# Patient Record
Sex: Female | Born: 1980
Health system: Southern US, Community
[De-identification: ages and names within clinical notes are randomized; demographics above are authoritative.]

## PROBLEM LIST (undated history)

## (undated) DIAGNOSIS — R87629 Unspecified abnormal cytological findings in specimens from vagina: Secondary | ICD-10-CM

## (undated) DIAGNOSIS — O139 Gestational [pregnancy-induced] hypertension without significant proteinuria, unspecified trimester: Secondary | ICD-10-CM

## (undated) HISTORY — DX: Gestational (pregnancy-induced) hypertension without significant proteinuria, unspecified trimester: O13.9

## (undated) HISTORY — PX: APPENDECTOMY: SHX54

## (undated) HISTORY — DX: Unspecified abnormal cytological findings in specimens from vagina: R87.629

---

## 2017-05-17 HISTORY — PX: WISDOM TOOTH EXTRACTION: SHX21

## 2018-05-18 DIAGNOSIS — O209 Hemorrhage in early pregnancy, unspecified: Secondary | ICD-10-CM | POA: Diagnosis not present

## 2018-05-31 DIAGNOSIS — O039 Complete or unspecified spontaneous abortion without complication: Secondary | ICD-10-CM | POA: Diagnosis not present

## 2018-05-31 DIAGNOSIS — O209 Hemorrhage in early pregnancy, unspecified: Secondary | ICD-10-CM | POA: Diagnosis not present

## 2019-02-18 DIAGNOSIS — H6120 Impacted cerumen, unspecified ear: Secondary | ICD-10-CM | POA: Diagnosis not present

## 2019-02-18 DIAGNOSIS — B029 Zoster without complications: Secondary | ICD-10-CM | POA: Diagnosis not present

## 2019-09-15 ENCOUNTER — Ambulatory Visit: Payer: Self-pay | Attending: Internal Medicine

## 2019-09-15 DIAGNOSIS — Z23 Encounter for immunization: Secondary | ICD-10-CM

## 2019-09-15 NOTE — Progress Notes (Signed)
   Covid-19 Vaccination Clinic  Name:  Meghan Bridges    MRN: [id number] DOB: 1980-07-26  09/15/2019  [name] was observed post Covid-19 immunization for 15 minutes without incident. She was provided with Vaccine Information Sheet and instruction to access the V-Safe system.   [name] was instructed to call 911 with any severe reactions post vaccine: Marland Kitchen Difficulty breathing  . Swelling of face and throat  . A fast heartbeat  . A bad rash all over body  . Dizziness and weakness   Immunizations Administered    Name Date Dose VIS Date Route   Pfizer COVID-19 Vaccine 09/15/2019 11:07 AM 0.3 mL 07/11/2018 Intramuscular   Manufacturer: [employer], [employer]   Lot: [id number]   NDC: [address]-2

## 2019-10-13 ENCOUNTER — Ambulatory Visit: Payer: Self-pay | Attending: Internal Medicine

## 2019-10-13 DIAGNOSIS — Z23 Encounter for immunization: Secondary | ICD-10-CM

## 2019-10-13 NOTE — Progress Notes (Signed)
   Covid-19 Vaccination Clinic  Name:  Meghan Bridges    MRN: [id number] DOB: 05-26-1980  10/13/2019  Meghan Bridges was observed post Covid-19 immunization for 15 minutes without incident. She was provided with Vaccine Information Sheet and instruction to access the V-Safe system.   Meghan Bridges was instructed to call 911 with any severe reactions post vaccine: Marland Kitchen Difficulty breathing  . Swelling of face and throat  . A fast heartbeat  . A bad rash all over body  . Dizziness and weakness   Immunizations Administered    Name Date Dose VIS Date Route   Pfizer COVID-19 Vaccine 10/13/2019  9:55 AM 0.3 mL 07/11/2018 Intramuscular   Manufacturer: [employer], [employer]   Lot: [id number]   NDC: [address]-8

## 2019-10-16 ENCOUNTER — Ambulatory Visit: Payer: Self-pay

## 2019-12-11 ENCOUNTER — Other Ambulatory Visit: Payer: Self-pay

## 2019-12-12 ENCOUNTER — Ambulatory Visit (INDEPENDENT_AMBULATORY_CARE_PROVIDER_SITE_OTHER): Payer: BC Managed Care – PPO | Admitting: Nurse Practitioner

## 2019-12-12 ENCOUNTER — Other Ambulatory Visit (HOSPITAL_COMMUNITY)
Admission: RE | Admit: 2019-12-12 | Discharge: 2019-12-12 | Disposition: A | Discharge status: Home / Self Care | Payer: BC Managed Care – PPO | Source: Ambulatory Visit | Attending: Nurse Practitioner | Admitting: Nurse Practitioner

## 2019-12-12 ENCOUNTER — Encounter: Payer: Self-pay | Admitting: Nurse Practitioner

## 2019-12-12 VITALS — BP 116/78 | HR 58 | Temp 97.7°F | Ht 64.0 in | Wt 154.6 lb

## 2019-12-12 DIAGNOSIS — Z113 Encounter for screening for infections with a predominantly sexual mode of transmission: Secondary | ICD-10-CM | POA: Insufficient documentation

## 2019-12-12 DIAGNOSIS — Z23 Encounter for immunization: Secondary | ICD-10-CM | POA: Diagnosis not present

## 2019-12-12 DIAGNOSIS — Z136 Encounter for screening for cardiovascular disorders: Secondary | ICD-10-CM

## 2019-12-12 DIAGNOSIS — Z13220 Encounter for screening for lipoid disorders: Secondary | ICD-10-CM | POA: Diagnosis not present

## 2019-12-12 DIAGNOSIS — R1013 Epigastric pain: Secondary | ICD-10-CM | POA: Insufficient documentation

## 2019-12-12 DIAGNOSIS — Z0000 Encounter for general adult medical examination without abnormal findings: Secondary | ICD-10-CM | POA: Diagnosis not present

## 2019-12-12 DIAGNOSIS — Z124 Encounter for screening for malignant neoplasm of cervix: Secondary | ICD-10-CM | POA: Diagnosis not present

## 2019-12-12 LAB — LIPID PANEL
Cholesterol: 188 mg/dL (ref 0–200)
HDL: 59.3 mg/dL (ref >39.00)
LDL Cholesterol: 117 mg/dL — ABNORMAL HIGH (ref 0–99)
NonHDL: 128.7
Total CHOL/HDL Ratio: 3
Triglycerides: 58 mg/dL (ref 0.0–149.0)
VLDL: 11.6 mg/dL (ref 0.0–40.0)

## 2019-12-12 LAB — COMPREHENSIVE METABOLIC PANEL
ALT: 13 U/L (ref 0–35)
AST: 18 U/L (ref 0–37)
Albumin: 4.4 g/dL (ref 3.5–5.2)
Alkaline Phosphatase: 28 U/L — ABNORMAL LOW (ref 39–117)
BUN: 12 mg/dL (ref 6–23)
CO2: 26 mEq/L (ref 19–32)
Calcium: 9.2 mg/dL (ref 8.4–10.5)
Chloride: 104 mEq/L (ref 96–112)
Creatinine, Ser: 0.93 mg/dL (ref 0.40–1.20)
GFR: 67.8 mL/min (ref >60.00)
Glucose, Bld: 114 mg/dL — ABNORMAL HIGH (ref 70–99)
Potassium: 4.1 mEq/L (ref 3.5–5.1)
Sodium: 135 mEq/L (ref 135–145)
Total Bilirubin: 0.6 mg/dL (ref 0.2–1.2)
Total Protein: 7.2 g/dL (ref 6.0–8.3)

## 2019-12-12 LAB — CBC
HCT: 40.6 % (ref 36.0–46.0)
Hemoglobin: 13.7 g/dL (ref 12.0–15.0)
MCHC: 33.8 g/dL (ref 30.0–36.0)
MCV: 87.2 fl (ref 78.0–100.0)
Platelets: 259 10*3/uL (ref 150.0–400.0)
RBC: 4.66 Mil/uL (ref 3.87–5.11)
RDW: 12.4 % (ref 11.5–15.5)
WBC: 6.4 10*3/uL (ref 4.0–10.5)

## 2019-12-12 LAB — TSH: TSH: 2.54 u[IU]/mL (ref 0.35–4.50)

## 2019-12-12 NOTE — Patient Instructions (Signed)
Thank you for choosing [facility] for your health needs.  Go to lab for blood draw.  Maintain adequate oral hydration, high fiber diet, and daily exercise. Start probiotic 1cap daily x 74month (florastor or align or live brand from healthy food store).  Diet for Irritable Bowel Syndrome When you have irritable bowel syndrome (IBS), it is very important to eat the foods and follow the eating habits that are best for your condition. IBS may cause various symptoms such as pain in the abdomen, constipation, or diarrhea. Choosing the right foods can help to ease the discomfort from these symptoms. Work with your health care provider and diet and nutrition specialist (dietitian) to find the eating plan that will help to control your symptoms. What are tips for following this plan?      Keep a food diary. This will help you identify foods that cause symptoms. Write down: ? What you eat and when you eat it. ? What symptoms you have. ? When symptoms occur in relation to your meals, such as "pain in abdomen 2 hours after dinner."  Eat your meals slowly and in a relaxed setting.  Aim to eat 5-6 small meals per day. Do not skip meals.  Drink enough fluid to keep your urine pale yellow.  Ask your health care provider if you should take an over-the-counter probiotic to help restore healthy bacteria in your gut (digestive tract). ? Probiotics are foods that contain good bacteria and yeasts.  Your dietitian may have specific dietary recommendations for you based on your symptoms. He or she may recommend that you: ? Avoid foods that cause symptoms. Talk with your dietitian about other ways to get the same nutrients that are in those problem foods. ? Avoid foods with gluten. Gluten is a protein that is found in rye, wheat, and barley. ? Eat more foods that contain soluble fiber. Examples of foods with high soluble fiber include oats, seeds, and certain fruits and vegetables. Take a fiber  supplement if directed by your dietitian. ? Reduce or avoid certain foods called FODMAPs. These are foods that contain carbohydrates that are hard to digest. Ask your doctor which foods contain these carbohydrates. What foods are not recommended? The following are some foods and drinks that may make your symptoms worse:  Fatty foods, such as french fries.  Foods that contain gluten, such as pasta and cereal.  Dairy products, such as milk, cheese, and ice cream.  Chocolate.  Alcohol.  Products with caffeine, such as coffee.  Carbonated drinks, such as soda.  Foods that are high in FODMAPs. These include certain fruits and vegetables.  Products with sweeteners such as honey, high fructose corn syrup, sorbitol, and mannitol. The items listed above may not be a complete list of foods and beverages you should avoid. Contact a dietitian for more information. What foods are good sources of fiber? Your health care provider or dietitian may recommend that you eat more foods that contain fiber. Fiber can help to reduce constipation and other IBS symptoms. Add foods with fiber to your diet a little at a time so your body can get used to them. Too much fiber at one time might cause gas and swelling of your abdomen. The following are some foods that are good sources of fiber:  Berries, such as raspberries, strawberries, and blueberries.  Tomatoes.  Carrots.  Brown rice.  Oats.  Seeds, such as chia and pumpkin seeds. The items listed above may not be a complete list  of recommended sources of fiber. Contact your dietitian for more options. Where to find more information  International Foundation for Functional Gastrointestinal Disorders: www.iffgd.[employer] of Diabetes and Digestive and Kidney Diseases: CarFlippers.tn Summary  When you have irritable bowel syndrome (IBS), it is very important to eat the foods and follow the eating habits that are best for your  condition.  IBS may cause various symptoms such as pain in the abdomen, constipation, or diarrhea.  Choosing the right foods can help to ease the discomfort that comes from symptoms.  Keep a food diary. This will help you identify foods that cause symptoms.  Your health care provider or diet and nutrition specialist (dietitian) may recommend that you eat more foods that contain fiber. This information is not intended to replace advice given to you by your health care provider. Make sure you discuss any questions you have with your health care provider. Document Revised: 08/23/2018 Document Reviewed: 01/04/2017 Elsevier Patient Education  2020 [employer].

## 2019-12-12 NOTE — Progress Notes (Signed)
Subjective:    Patient ID: Meghan Bridges, female    DOB: 26-Aug-1980, 39 y.o.   MRN: [id number]  Patient presents today for CPE, establish care (new patient) and eval of ABD bloating  Abdominal Cramping This is a chronic problem. The current episode started more than 1 year ago. The onset quality is gradual. The problem occurs intermittently. The problem has been waxing and waning. The pain is located in the generalized abdominal region. The quality of the pain is a sensation of fullness and colicky. The abdominal pain does not radiate. Associated symptoms include belching. Pertinent negatives include no anorexia, arthralgias, constipation, diarrhea, dysuria, fever, flatus, frequency, headaches, hematochezia, hematuria, melena, myalgias, nausea, vomiting or weight loss. The pain is aggravated by eating. Relieved by: avoiding high gluten and diary. She has tried antacids (avoiding certain foods) for the symptoms. The treatment provided significant relief. There is no history of GERD, irritable bowel syndrome or pancreatitis.   Sexual History (orientation,birth control, marital status, STD): married, sexually active, no child  Depression/Suicide: Depression screen [facility] 2/9 12/12/2019  Decreased Interest 0  Down, Depressed, Hopeless 0  PHQ - 2 Score 0   Vision:up to date  Dental:up to date  Immunizations: (TDAP, Hep C screen, Pneumovax, Influenza, zoster)  Health Maintenance  Topic Date Due   Flu Shot  12/16/2019   Pap Smear  12/12/2022   Tetanus Vaccine  12/11/2029   COVID-19 Vaccine  Completed    Hepatitis C: One time screening is recommended by Center for Disease Control  (CDC) for  adults born from [birth year] through 1963.   Completed   HIV Screening  Completed   Diet:regular.  Weight:  Wt Readings from Last 3 Encounters:  12/12/19 154 lb 9.6 oz (70.1 kg)   Fall Risk: Fall Risk  12/12/2019  Falls in the past year? 0     Medications and allergies reviewed with patient and  updated if appropriate.  Patient Active Problem List   Diagnosis Date Noted   Dyspepsia 12/12/2019    No current outpatient medications on file prior to visit.   No current facility-administered medications on file prior to visit.   No past medical history on file.   Social History   Socioeconomic History   Marital status: Married    Spouse name: Not on file   Number of children: Not on file   Years of education: Not on file   Highest education level: Not on file  Occupational History   Not on file  Tobacco Use   Smoking status: Former Smoker    Types: Cigarettes   Smokeless tobacco: Never Used  Building services engineer Use: Never used  Substance and Sexual Activity   Alcohol use: Never   Drug use: Never   Sexual activity: Yes    Birth control/protection: None  Other Topics Concern   Not on file  Social History Narrative   Not on file   Social Determinants of Health   Financial Resource Strain:    Difficulty of Paying Living Expenses:   Food Insecurity:    Worried About Programme researcher, broadcasting/film/video in the Last Year:    Barista in the Last Year:   Transportation Needs:    Freight forwarder (Medical):    Lack of Transportation (Non-Medical):   Physical Activity:    Days of Exercise per Week:    Minutes of Exercise per Session:   Stress:    Feeling of Stress :   Social Connections:  Frequency of Communication with Friends and Family:    Frequency of Social Gatherings with Friends and Family:    Attends Religious Services:    Active Member of Clubs or Organizations:    Attends Engineer, structural:    Marital Status:     Family History  Problem Relation Age of Onset   Heart disease Mother    Heart disease Father    Mental illness Maternal Uncle    Alzheimer's disease Maternal Grandmother    Cancer Maternal Grandfather        Review of Systems  Constitutional: Negative for fever and weight loss.    Gastrointestinal: Negative for anorexia, constipation, diarrhea, flatus, hematochezia, melena, nausea and vomiting.  Genitourinary: Negative for dysuria, frequency and hematuria.  Musculoskeletal: Negative for arthralgias and myalgias.  Neurological: Negative for headaches.    Objective:   Vitals:   12/12/19 0854  BP: 116/78  Pulse: 58  Temp: 97.7 F (36.5 C)  SpO2: 98%    Body mass index is 26.54 kg/m.   Physical Examination:  Physical Exam Vitals reviewed. Exam conducted with a chaperone present.  Constitutional:      General: She is not in acute distress.    Appearance: She is well-developed.  HENT:     Right Ear: Tympanic membrane, ear canal and external ear normal.     Left Ear: Tympanic membrane, ear canal and external ear normal.  Eyes:     Extraocular Movements: Extraocular movements intact.     Conjunctiva/sclera: Conjunctivae normal.  Cardiovascular:     Rate and Rhythm: Normal rate and regular rhythm.     Heart sounds: Normal heart sounds.  Pulmonary:     Effort: Pulmonary effort is normal. No respiratory distress.     Breath sounds: Normal breath sounds.  Chest:     Chest wall: No tenderness.     Breasts:        Right: Normal.        Left: Normal.  Abdominal:     General: Bowel sounds are normal.     Palpations: Abdomen is soft.  Genitourinary:    Labia:        Right: No rash or tenderness.        Left: No rash or tenderness.      Vagina: Normal.     Cervix: Normal.     Uterus: Normal.      Adnexa: Right adnexa normal and left adnexa normal.  Musculoskeletal:        General: Normal range of motion.     Cervical back: Normal range of motion and neck supple.     Right lower leg: No edema.     Left lower leg: No edema.  Lymphadenopathy:     Cervical: No cervical adenopathy.     Upper Body:     Right upper body: No supraclavicular, axillary or pectoral adenopathy.     Left upper body: No supraclavicular, axillary or pectoral adenopathy.      Lower Body: No right inguinal adenopathy. No left inguinal adenopathy.  Skin:    General: Skin is warm and dry.  Neurological:     Mental Status: She is alert and oriented to person, place, and time.     Deep Tendon Reflexes: Reflexes are normal and symmetric.  Psychiatric:        Mood and Affect: Mood normal.        Behavior: Behavior normal.        Thought Content: Thought content normal.  ASSESSMENT and PLAN: This visit occurred during the SARS-CoV-2 public health emergency.  Safety protocols were in place, including screening questions prior to the visit, additional usage of staff PPE, and extensive cleaning of exam room while observing appropriate contact time as indicated for disinfecting solutions.   Charlena was seen today for establish care.  Diagnoses and all orders for this visit:  Preventative health care -     CBC -     Comprehensive metabolic panel -     TSH -     Lipid panel  Encounter for lipid screening for cardiovascular disease -     Lipid panel  Screen for STD (sexually transmitted disease) -     Cervicovaginal ancillary only( [place]) -     HIV antibody (with reflex) -     Hepatitis C Antibody  Encounter for Papanicolaou smear for cervical cancer screening -     Cytology - PAP( [place])  Need for Tdap vaccination -     Tdap vaccine greater than or equal to 7yo IM  Dyspepsia        Problem List Items Addressed This Visit      Other   Dyspepsia    Chronic, waxing and waning, trigger by certain foods (gluten and diary). Use TUMs or prilosec or famotidine prn. Maintain adequate oral hydration, high fiber diet, and daily exercise. Avoid triggers Start probiotic 1cap daily x 23month (florastor or align or live brand from healthy food store).       Other Visit Diagnoses    Preventative health care    -  Primary   Relevant Orders   CBC (Completed)   Comprehensive metabolic panel (Completed)   TSH (Completed)   Lipid panel  (Completed)   Encounter for lipid screening for cardiovascular disease       Relevant Orders   Lipid panel (Completed)   Screen for STD (sexually transmitted disease)       Relevant Orders   Cervicovaginal ancillary only( [place]) (Completed)   HIV antibody (with reflex) (Completed)   Hepatitis C Antibody (Completed)   Encounter for Papanicolaou smear for cervical cancer screening       Relevant Orders   Cytology - PAP( [place]) (Completed)   Need for Tdap vaccination       Relevant Orders   Tdap vaccine greater than or equal to 7yo IM (Completed)      Follow up: Return in about 1 year (around 12/11/2020) for CPE (fasting).  Alysia Penna, NP

## 2019-12-13 LAB — CERVICOVAGINAL ANCILLARY ONLY
Chlamydia: NEGATIVE
Comment: NEGATIVE
Comment: NEGATIVE
Comment: NORMAL
Neisseria Gonorrhea: NEGATIVE
Trichomonas: NEGATIVE

## 2019-12-13 LAB — HIV ANTIBODY (ROUTINE TESTING W REFLEX): HIV 1&2 Ab, 4th Generation: NONREACTIVE

## 2019-12-13 LAB — CYTOLOGY - PAP
Comment: NEGATIVE
Diagnosis: NEGATIVE
High risk HPV: NEGATIVE

## 2019-12-13 LAB — HEPATITIS C ANTIBODY
Hepatitis C Ab: NONREACTIVE
SIGNAL TO CUT-OFF: 0.01 (ref <1.00)

## 2019-12-14 ENCOUNTER — Encounter: Payer: Self-pay | Admitting: Nurse Practitioner

## 2019-12-14 NOTE — Assessment & Plan Note (Signed)
Chronic, waxing and waning, trigger by certain foods (gluten and diary). Use TUMs or prilosec or famotidine prn. Maintain adequate oral hydration, high fiber diet, and daily exercise. Avoid triggers Start probiotic 1cap daily x 43month (florastor or align or live brand from healthy food store).

## 2020-02-25 ENCOUNTER — Telehealth: Payer: Self-pay | Admitting: Nurse Practitioner

## 2020-02-25 ENCOUNTER — Other Ambulatory Visit: Payer: Self-pay

## 2020-02-25 NOTE — Telephone Encounter (Signed)
Patient is calling and wanted to see if Claris Gower can put in a referral to see an OBGYN due to pregnancy, please advise. CB is [phone]

## 2020-02-26 ENCOUNTER — Other Ambulatory Visit: Payer: Self-pay

## 2020-02-26 ENCOUNTER — Ambulatory Visit: Payer: BC Managed Care – PPO | Admitting: Nurse Practitioner

## 2020-02-26 DIAGNOSIS — Z3A01 Less than 8 weeks gestation of pregnancy: Secondary | ICD-10-CM

## 2020-02-29 NOTE — Telephone Encounter (Signed)
Patient called back and wanted to see if her OBGYN referral can be switched to [facility], please advise. CB is [phone]

## 2020-03-03 NOTE — Telephone Encounter (Signed)
Done

## 2020-03-06 DIAGNOSIS — Z3200 Encounter for pregnancy test, result unknown: Secondary | ICD-10-CM | POA: Diagnosis not present

## 2020-03-06 DIAGNOSIS — Z3689 Encounter for other specified antenatal screening: Secondary | ICD-10-CM | POA: Diagnosis not present

## 2020-03-13 DIAGNOSIS — Z3201 Encounter for pregnancy test, result positive: Secondary | ICD-10-CM | POA: Diagnosis not present

## 2020-03-26 DIAGNOSIS — O09299 Supervision of pregnancy with other poor reproductive or obstetric history, unspecified trimester: Secondary | ICD-10-CM | POA: Diagnosis not present

## 2020-03-26 DIAGNOSIS — Z3A10 10 weeks gestation of pregnancy: Secondary | ICD-10-CM | POA: Diagnosis not present

## 2020-03-26 DIAGNOSIS — Z3689 Encounter for other specified antenatal screening: Secondary | ICD-10-CM | POA: Diagnosis not present

## 2020-03-26 DIAGNOSIS — O09291 Supervision of pregnancy with other poor reproductive or obstetric history, first trimester: Secondary | ICD-10-CM | POA: Diagnosis not present

## 2020-04-07 DIAGNOSIS — Z3682 Encounter for antenatal screening for nuchal translucency: Secondary | ICD-10-CM | POA: Diagnosis not present

## 2020-04-07 DIAGNOSIS — O09521 Supervision of elderly multigravida, first trimester: Secondary | ICD-10-CM | POA: Diagnosis not present

## 2020-04-07 DIAGNOSIS — Z3A12 12 weeks gestation of pregnancy: Secondary | ICD-10-CM | POA: Diagnosis not present

## 2020-05-06 DIAGNOSIS — Z361 Encounter for antenatal screening for raised alphafetoprotein level: Secondary | ICD-10-CM | POA: Diagnosis not present

## 2020-05-17 NOTE — L&D Delivery Note (Signed)
Delivery Note:   [name] at [redacted]w[redacted]d  Admitting diagnosis: Encounter for induction of labor [Z34.90] PIH (pregnancy induced hypertension) [O13.9] Risks: Gestational hypertension   First Stage:  Induction of labor: s/p buccal cytotec x 2 doses, intracervical foley balloon Onset of labor: 0310 Augmentation: AROM and Pitocin AROM: 0310 clear fluid Active labor onset: approx 8am Analgesia /Anesthesia/Pain control intrapartum: Epidural   Second Stage:  Complete dilation at 09/29/2020  1444 Onset of pushing at 1521 FHR second stage Category 2  Pushing in lithotomy position with CNM, doula and L&D staff support, spouse, Alycia Rossetti present for birth and supportive. Nuchal Cord: No  Delivery of a Live born female "Hillary Bow" Birth Weight:  pending APGAR: 8, 9  Newborn Delivery   Birth date/time: 09/29/2020 15:43:00 Delivery type: Vaginal, Spontaneous      in cephalic presentation, position ROA to ROT, shoulders and body easily followed  Cord double clamped after cessation of pulsation, cut by FOB.  Collection of cord blood for typing completed. Cord blood donation-None  Arterial cord blood sample-No    Third Stage:  Placenta delivered-Spontaneous  with 3 vessels . Marginal cord insertion noted.  Uterine tone firm bleeding appropriate Uterotonics: IV Pitocin bolus Placenta to hospital disposal.   2nd degree perineal laceration identified.  Episiotomy:None  Local analgesia: 1% Lidocaine  Repair: 3.0 vicryl in standard fashion with separate subcuticular layer closure.  Good hemostasis noted Est. Blood Loss (mL):0.00  Complications: None   Mom to postpartum.  Baby "Hillary Bow" to Couplet care / Skin to Skin.  Delivery Report:  Review the Delivery Report for details.     Signed: Karena Addison, CNM, MSN 09/29/2020, 4:43 PM

## 2020-05-29 DIAGNOSIS — O358XX0 Maternal care for other (suspected) fetal abnormality and damage, not applicable or unspecified: Secondary | ICD-10-CM | POA: Diagnosis not present

## 2020-05-29 DIAGNOSIS — O09522 Supervision of elderly multigravida, second trimester: Secondary | ICD-10-CM | POA: Diagnosis not present

## 2020-05-29 DIAGNOSIS — Z363 Encounter for antenatal screening for malformations: Secondary | ICD-10-CM | POA: Diagnosis not present

## 2020-05-29 DIAGNOSIS — Z3A19 19 weeks gestation of pregnancy: Secondary | ICD-10-CM | POA: Diagnosis not present

## 2020-06-03 ENCOUNTER — Telehealth: Payer: Self-pay

## 2020-06-03 NOTE — Telephone Encounter (Signed)
Left vm for patient and sending her a message in MyChart to get appt scheduled with [place].    Good morning,   We received a referral from [facility] to get you scheduled for a [place] MFM OB DETAIL + 14 WK Ultrasound!  We reached out to you and left a voicemail for you to call back. If you could give our scheduling team a call at [phone], we would love to get it scheduled for you!   Our Location:   [facility] (Second Floor)  Address:   [address]  [place], [place] [address]  We would like to make you aware that you are allowed to have one person with you for your ultrasound that is 13 years or older. If you have children 12 years or older, they must have an adult present with them in the lobby while you are in your appointment.

## 2020-06-09 NOTE — Telephone Encounter (Signed)
Called patient.  Patient stated she's seeking a second opinion due to the result of her ultrasound. Patient will call back if still needs to see Maternal Medicine doctors.  Patient marriage name is [place].

## 2020-06-24 ENCOUNTER — Other Ambulatory Visit: Payer: Self-pay | Admitting: Obstetrics and Gynecology

## 2020-06-24 DIAGNOSIS — Z3A24 24 weeks gestation of pregnancy: Secondary | ICD-10-CM

## 2020-06-24 DIAGNOSIS — Z363 Encounter for antenatal screening for malformations: Secondary | ICD-10-CM

## 2020-06-24 DIAGNOSIS — O283 Abnormal ultrasonic finding on antenatal screening of mother: Secondary | ICD-10-CM

## 2020-06-27 ENCOUNTER — Encounter: Payer: Self-pay | Admitting: *Deleted

## 2020-07-01 ENCOUNTER — Encounter: Payer: Self-pay | Admitting: *Deleted

## 2020-07-01 ENCOUNTER — Other Ambulatory Visit: Payer: Self-pay

## 2020-07-01 ENCOUNTER — Ambulatory Visit: Payer: BC Managed Care – PPO | Admitting: *Deleted

## 2020-07-01 ENCOUNTER — Ambulatory Visit: Payer: BC Managed Care – PPO | Attending: Obstetrics and Gynecology

## 2020-07-01 ENCOUNTER — Other Ambulatory Visit: Payer: Self-pay | Admitting: *Deleted

## 2020-07-01 VITALS — BP 115/72 | HR 91

## 2020-07-01 DIAGNOSIS — O09522 Supervision of elderly multigravida, second trimester: Secondary | ICD-10-CM | POA: Diagnosis not present

## 2020-07-01 DIAGNOSIS — O283 Abnormal ultrasonic finding on antenatal screening of mother: Secondary | ICD-10-CM | POA: Diagnosis not present

## 2020-07-01 DIAGNOSIS — Z369 Encounter for antenatal screening, unspecified: Secondary | ICD-10-CM | POA: Diagnosis not present

## 2020-07-01 DIAGNOSIS — Z363 Encounter for antenatal screening for malformations: Secondary | ICD-10-CM | POA: Diagnosis not present

## 2020-07-01 DIAGNOSIS — Z3A24 24 weeks gestation of pregnancy: Secondary | ICD-10-CM | POA: Diagnosis not present

## 2020-07-01 DIAGNOSIS — O358XX0 Maternal care for other (suspected) fetal abnormality and damage, not applicable or unspecified: Secondary | ICD-10-CM | POA: Insufficient documentation

## 2020-07-14 DIAGNOSIS — Z3689 Encounter for other specified antenatal screening: Secondary | ICD-10-CM | POA: Diagnosis not present

## 2020-08-04 DIAGNOSIS — Z23 Encounter for immunization: Secondary | ICD-10-CM | POA: Diagnosis not present

## 2020-08-04 DIAGNOSIS — Z3689 Encounter for other specified antenatal screening: Secondary | ICD-10-CM | POA: Diagnosis not present

## 2020-08-05 ENCOUNTER — Ambulatory Visit: Payer: BC Managed Care – PPO

## 2020-08-08 DIAGNOSIS — Z3A29 29 weeks gestation of pregnancy: Secondary | ICD-10-CM | POA: Diagnosis not present

## 2020-08-08 DIAGNOSIS — O43123 Velamentous insertion of umbilical cord, third trimester: Secondary | ICD-10-CM | POA: Diagnosis not present

## 2020-09-03 DIAGNOSIS — O43123 Velamentous insertion of umbilical cord, third trimester: Secondary | ICD-10-CM | POA: Diagnosis not present

## 2020-09-03 DIAGNOSIS — Z3A33 33 weeks gestation of pregnancy: Secondary | ICD-10-CM | POA: Diagnosis not present

## 2020-09-23 DIAGNOSIS — O43123 Velamentous insertion of umbilical cord, third trimester: Secondary | ICD-10-CM | POA: Diagnosis not present

## 2020-09-23 DIAGNOSIS — Z3A36 36 weeks gestation of pregnancy: Secondary | ICD-10-CM | POA: Diagnosis not present

## 2020-09-23 DIAGNOSIS — Z3685 Encounter for antenatal screening for Streptococcus B: Secondary | ICD-10-CM | POA: Diagnosis not present

## 2020-09-23 DIAGNOSIS — R030 Elevated blood-pressure reading, without diagnosis of hypertension: Secondary | ICD-10-CM | POA: Diagnosis not present

## 2020-09-23 LAB — OB RESULTS CONSOLE GBS: GBS: NEGATIVE

## 2020-09-24 ENCOUNTER — Encounter (HOSPITAL_COMMUNITY): Payer: Self-pay | Admitting: Obstetrics and Gynecology

## 2020-09-24 ENCOUNTER — Inpatient Hospital Stay (HOSPITAL_COMMUNITY)
Admission: AD | Admit: 2020-09-24 | Discharge: 2020-09-24 | Disposition: A | Discharge status: Home / Self Care | Payer: BC Managed Care – PPO | Attending: Obstetrics and Gynecology | Admitting: Obstetrics and Gynecology

## 2020-09-24 ENCOUNTER — Other Ambulatory Visit: Payer: Self-pay

## 2020-09-24 DIAGNOSIS — R519 Headache, unspecified: Secondary | ICD-10-CM | POA: Diagnosis not present

## 2020-09-24 DIAGNOSIS — O26893 Other specified pregnancy related conditions, third trimester: Secondary | ICD-10-CM | POA: Diagnosis not present

## 2020-09-24 DIAGNOSIS — Z3A36 36 weeks gestation of pregnancy: Secondary | ICD-10-CM | POA: Diagnosis not present

## 2020-09-24 DIAGNOSIS — Z87891 Personal history of nicotine dependence: Secondary | ICD-10-CM | POA: Diagnosis not present

## 2020-09-24 DIAGNOSIS — O133 Gestational [pregnancy-induced] hypertension without significant proteinuria, third trimester: Secondary | ICD-10-CM | POA: Diagnosis not present

## 2020-09-24 DIAGNOSIS — Z8249 Family history of ischemic heart disease and other diseases of the circulatory system: Secondary | ICD-10-CM | POA: Insufficient documentation

## 2020-09-24 DIAGNOSIS — Z3490 Encounter for supervision of normal pregnancy, unspecified, unspecified trimester: Secondary | ICD-10-CM

## 2020-09-24 DIAGNOSIS — M7989 Other specified soft tissue disorders: Secondary | ICD-10-CM | POA: Diagnosis not present

## 2020-09-24 DIAGNOSIS — O139 Gestational [pregnancy-induced] hypertension without significant proteinuria, unspecified trimester: Secondary | ICD-10-CM | POA: Diagnosis present

## 2020-09-24 LAB — COMPREHENSIVE METABOLIC PANEL
ALT: 24 U/L (ref 0–44)
AST: 26 U/L (ref 15–41)
Albumin: 3 g/dL — ABNORMAL LOW (ref 3.5–5.0)
Alkaline Phosphatase: 98 U/L (ref 38–126)
Anion gap: 8 (ref 5–15)
BUN: 11 mg/dL (ref 6–20)
CO2: 23 mmol/L (ref 22–32)
Calcium: 9.3 mg/dL (ref 8.9–10.3)
Chloride: 105 mmol/L (ref 98–111)
Creatinine, Ser: 0.74 mg/dL (ref 0.44–1.00)
GFR, Estimated: >60 mL/min (ref >60)
Glucose, Bld: 108 mg/dL — ABNORMAL HIGH (ref 70–99)
Potassium: 4.4 mmol/L (ref 3.5–5.1)
Sodium: 136 mmol/L (ref 135–145)
Total Bilirubin: 0.6 mg/dL (ref 0.3–1.2)
Total Protein: 6.3 g/dL — ABNORMAL LOW (ref 6.5–8.1)

## 2020-09-24 LAB — CBC
HCT: 40.1 % (ref 36.0–46.0)
Hemoglobin: 13.4 g/dL (ref 12.0–15.0)
MCH: 30.4 pg (ref 26.0–34.0)
MCHC: 33.4 g/dL (ref 30.0–36.0)
MCV: 90.9 fL (ref 80.0–100.0)
Platelets: 206 10*3/uL (ref 150–400)
RBC: 4.41 MIL/uL (ref 3.87–5.11)
RDW: 13.4 % (ref 11.5–15.5)
WBC: 8.6 10*3/uL (ref 4.0–10.5)
nRBC: 0 % (ref 0.0–0.2)

## 2020-09-24 LAB — PROTEIN / CREATININE RATIO, URINE
Creatinine, Urine: 26.65 mg/dL
Total Protein, Urine: <6 mg/dL

## 2020-09-24 MED ORDER — NIFEDIPINE ER 30 MG PO TB24: 30.0000 mg | ORAL_TABLET | Freq: Every day | ORAL | 0 refills | Status: AC

## 2020-09-24 MED ORDER — NIFEDIPINE ER OSMOTIC RELEASE 30 MG PO TB24
30.0000 mg | ORAL_TABLET | Freq: Every day | ORAL | Status: DC
  Administered 2020-09-24: 30 mg via ORAL
  Filled 2020-09-24: qty 1

## 2020-09-24 NOTE — MAU Note (Signed)
Pt reports she was sent from her doctor's office for pre-eclampsia workup. Reports swelling and headache, denies blurry vision, but is dizzy. Denies cntrx, Vag bleeding. +FM

## 2020-09-24 NOTE — MAU Provider Note (Addendum)
History    Meghan Bridges is a 40 y.o. [name] at [redacted]w[redacted]d sent from office for extended monitoring, serial BP and PEC labs after being seen for elevated BP and found to have severe range pressures 160's/100's. Prior to this she had been seen a day ago and noted to have mild range BP which decreased to high normal on recheck. No neural sx and PEC labs yesterday wnl except PCR 0.26. Today she denies any PEC sx, notes vague head pressure but not overt HA.  No ctx, no LOF/VB, (+)FM.   CSN: [id number]  Arrival date and time: 09/24/20 1552   Event Date/Time   First Provider Initiated Contact with Patient 09/24/20 1634      Chief Complaint  Patient presents with  . Hypertension  . Edema  . Headache   HPI  OB History    Gravida  3   Para      Term      Preterm      AB  2   Living  0     SAB  2   IAB      Ectopic      Multiple      Live Births              Past Medical History:  Diagnosis Date  . Vaginal Pap smear, abnormal     Past Surgical History:  Procedure Laterality Date  . APPENDECTOMY    . WISDOM TOOTH EXTRACTION  2019    Family History  Problem Relation Age of Onset  . Heart disease Mother   . Heart disease Father   . Mental illness Maternal Uncle   . Alzheimer's disease Maternal Grandmother   . Cancer Maternal Grandfather     Social History   Tobacco Use  . Smoking status: Former Smoker    Types: Cigarettes  . Smokeless tobacco: Never Used  Vaping Use  . Vaping Use: Never used  Substance Use Topics  . Alcohol use: Never  . Drug use: Never    Allergies:  Allergies  Allergen Reactions  . Latex Itching    Medications Prior to Admission  Medication Sig Dispense Refill Last Dose  . Prenatal Vit-Fe Fumarate-FA (PRENATAL MULTIVITAMIN) TABS tablet Take 1 tablet by mouth daily at 12 noon.   09/23/2020 at Unknown time    Review of Systems  As noted above  Physical Exam   Blood pressure (!) 144/91, pulse 85, temperature 98 F  (36.7 C), temperature source Oral, resp. rate 16, height 5\' 4"  (1.626 m), weight 87.2 kg, last menstrual period 01/13/2020, SpO2 98 %.   Patient Vitals for the past 24 hrs:  BP Temp Temp src Pulse Resp SpO2 Height Weight  09/24/20 1920 128/89 98.2 F (36.8 C) Oral 94 18 99 % -- --  09/24/20 1846 139/89 -- -- 91 -- -- -- --  09/24/20 1831 (!) 144/91 -- -- 85 -- -- -- --  09/24/20 1816 (!) 143/93 -- -- 86 -- -- -- --  09/24/20 1801 (!) 154/93 -- -- 89 -- -- -- --  09/24/20 1746 (!) 141/94 -- -- 87 -- -- -- --  09/24/20 1731 (!) 136/97 -- -- 100 -- -- -- --  09/24/20 1716 (!) 141/95 -- -- 87 -- -- -- --  09/24/20 1701 (!) 134/91 -- -- 81 -- -- -- --  09/24/20 1646 136/90 -- -- 94 -- -- -- --  09/24/20 1631 127/88 -- -- 96 -- -- -- --  09/24/20 1630  119/81 -- -- (!) 110 -- -- -- --  09/24/20 1628 108/80 -- -- 99 -- -- -- --  09/24/20 1605 (!) 135/95 98 F (36.7 C) Oral 89 16 98 % 5\' 4"  (1.626 m) 87.2 kg    Physical Exam  Gen: AAO x 3, NAD, comfortable CV: RRR Pulm: CTAB ABd: gravid, NT, S+D GU: SVE 1/50/-3 Ext: no edema, no clonus  EFM FHR 140, mod var, + accels, no decels Ctx occasional, mild  Results for orders placed or performed during the hospital encounter of 09/24/20 (from the past 24 hour(s))  Protein / creatinine ratio, urine     Status: None   Collection Time: 09/24/20  4:10 PM  Result Value Ref Range   Creatinine, Urine 26.65 mg/dL   Total Protein, Urine <6 mg/dL   Protein Creatinine Ratio        0.00 - 0.15 mg/mg[Cre]  CBC     Status: None   Collection Time: 09/24/20  4:16 PM  Result Value Ref Range   WBC 8.6 4.0 - 10.5 K/uL   RBC 4.41 3.87 - 5.11 MIL/uL   Hemoglobin 13.4 12.0 - 15.0 g/dL   HCT 11/24/20 51.7 - 61.6 %   MCV 90.9 80.0 - 100.0 fL   MCH 30.4 26.0 - 34.0 pg   MCHC 33.4 30.0 - 36.0 g/dL   RDW 07.3 71.0 - 62.6 %   Platelets 206 150 - 400 K/uL   nRBC 0.0 0.0 - 0.2 %  Comprehensive metabolic panel     Status: Abnormal   Collection Time: 09/24/20   4:16 PM  Result Value Ref Range   Sodium 136 135 - 145 mmol/L   Potassium 4.4 3.5 - 5.1 mmol/L   Chloride 105 98 - 111 mmol/L   CO2 23 22 - 32 mmol/L   Glucose, Bld 108 (H) 70 - 99 mg/dL   BUN 11 6 - 20 mg/dL   Creatinine, Ser 11/24/20 0.44 - 1.00 mg/dL   Calcium 9.3 8.9 - 5.46 mg/dL   Total Protein 6.3 (L) 6.5 - 8.1 g/dL   Albumin 3.0 (L) 3.5 - 5.0 g/dL   AST 26 15 - 41 U/L   ALT 24 0 - 44 U/L   Alkaline Phosphatase 98 38 - 126 U/L   Total Bilirubin 0.6 0.3 - 1.2 mg/dL   GFR, Estimated 27.0 >35 mL/min   Anion gap 8 5 - 15   MAU Course  Procedures Serial BP, NST, PEC labs  MDM Patient monitored in MAU for several hours. BP noted to be labile, preeclampsia labs stable and no trend noted.  Started on Procardia 30 XL and given preeclamptic reportable symptoms instructions.  Patient will follow up in office for BP and ANFT on Friday with planned IOL on Sunday/Monday at 37 wks.    Assessment and Plan  [name] at [redacted]w[redacted]d  Gestational hypertension FHT category 1  Started Procardia 30XL Start vaginal evening primrose oil nightly for cervical ripening Follow up in office 5/13 for BP check and modified BPP Scheduled IOL 37 wks  PEC precautions and modified rest reviewed.   POC in consult w/ Dr. 6/13.   Conni Elliot, MSN, CNM 09/24/2020, 8:25 PM   11/24/2020 09/24/2020, 7:03 PM

## 2020-09-24 NOTE — MAU Provider Note (Signed)
History     CSN: [id number]  Arrival date and time: 09/24/20 1552   Event Date/Time   First Provider Initiated Contact with Patient 09/24/20 1634      Chief Complaint  Patient presents with  . Hypertension  . Edema  . Headache   Ms. Meghan Bridges is a 40 y.o. year old G81P0020 female at [redacted]w[redacted]d weeks gestation who was sent to MAU from [facility] office where she is reporting elevated BPs "the past 3 visits." She reports she had 2 elevated BPs today in the office; "1 was severe range and 1 was mild range." She can't recall what any of the BPs were. Per the prenatal records the highest documented was 142/93 on yesterday; today's visit was not scanned in Epic. She also complains of swelling in hands, H/A and dizziness. She was counseled yesterday and today on the POC, if she had gHTN vs. PEC; ie IOL at 37 wks, BP cuff at home and BMZ today and tomorrow. She denies blurry vision, epigastric pain, UC's or VB. She reports (+) FM. 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 Her spouse is present and contributing to the history taking.   OB History    Gravida  3   Para      Term      Preterm      AB  2   Living  0     SAB  2   IAB      Ectopic      Multiple      Live Births              Past Medical History:  Diagnosis Date  . Vaginal Pap smear, abnormal     Past Surgical History:  Procedure Laterality Date  . APPENDECTOMY    . WISDOM TOOTH EXTRACTION  2019    Family History  Problem Relation Age of Onset  . Heart disease Mother   . Heart disease Father   . Mental illness Maternal Uncle   . Alzheimer's disease Maternal Grandmother   . Cancer Maternal Grandfather     Social History   Tobacco Use  . Smoking status: Former Smoker    Types: Cigarettes  . Smokeless tobacco: Never Used  Vaping Use  . Vaping Use: Never used  Substance Use Topics  . Alcohol use: Never  . Drug use: Never    Allergies:  Allergies  Allergen Reactions  . Latex  Itching    Medications Prior to Admission  Medication Sig Dispense Refill Last Dose  . Prenatal Vit-Fe Fumarate-FA (PRENATAL MULTIVITAMIN) TABS tablet Take 1 tablet by mouth daily at 12 noon.   09/23/2020 at Unknown time    Review of Systems  Constitutional: Negative.   HENT: Negative.   Eyes: Negative.   Respiratory: Negative.   Cardiovascular: Positive for leg swelling ("mostly in feet").  Gastrointestinal: Negative.   Endocrine: Negative.   Genitourinary: Negative.   Musculoskeletal: Negative.        Hand swelling  Skin: Negative.   Allergic/Immunologic: Negative.   Neurological: Positive for dizziness and headaches.  Hematological: Negative.   Psychiatric/Behavioral: Negative.    Physical Exam   Patient Vitals for the past 24 hrs:  BP Temp Temp src Pulse Resp SpO2 Height Weight  09/24/20 1831 (!) 144/91 -- -- 85 -- -- -- --  09/24/20 1816 (!) 143/93 -- -- 86 -- -- -- --  09/24/20 1801 (!) 154/93 -- -- 89 -- -- -- --  09/24/20 1746 Marland Kitchen)  141/94 -- -- 55 -- -- -- --  09/24/20 1731 (!) 136/97 -- -- 100 -- -- -- --  09/24/20 1716 (!) 141/95 -- -- 87 -- -- -- --  09/24/20 1701 (!) 134/91 -- -- 81 -- -- -- --  09/24/20 1646 136/90 -- -- 94 -- -- -- --  09/24/20 1631 127/88 -- -- 96 -- -- -- --  09/24/20 1630 119/81 -- -- (!) 110 -- -- -- --  09/24/20 1628 108/80 -- -- 99 -- -- -- --  09/24/20 1605 (!) 135/95 98 F (36.7 C) Oral 89 16 98 % 5\' 4"  (1.626 m) 87.2 kg    Physical Exam Vitals and nursing note reviewed.  Constitutional:      Appearance: Normal appearance. She is obese.  Cardiovascular:     Rate and Rhythm: Normal rate.     Pulses: Normal pulses.     Heart sounds: Normal heart sounds.  Pulmonary:     Effort: Pulmonary effort is normal.     Breath sounds: Normal breath sounds.  Abdominal:     General: Bowel sounds are normal.     Palpations: Abdomen is soft.  Genitourinary:    Comments: Not indicated Musculoskeletal:        General: Normal range of  motion.     Cervical back: Normal range of motion.  Skin:    General: Skin is warm and dry.  Neurological:     Mental Status: She is alert and oriented to person, place, and time.  Psychiatric:        Mood and Affect: Mood normal.        Behavior: Behavior normal.        Thought Content: Thought content normal.        Judgment: Judgment normal.   REACTIVE NST - FHR: 140 bpm / moderate variability / accels present / decels absent / TOCO: Occ UI noted MAU Course  Procedures  MDM CCUA CBC CMP P/C Ratio Serial BP's   Results for orders placed or performed during the hospital encounter of 09/24/20 (from the past 24 hour(s))  Protein / creatinine ratio, urine     Status: None   Collection Time: 09/24/20  4:10 PM  Result Value Ref Range   Creatinine, Urine 26.65 mg/dL   Total Protein, Urine <6 mg/dL   Protein Creatinine Ratio RESULT BELOW REPORTABLE RANGE,  UNABLE TO CALCULATE    0.00 - 0.15 mg/mg[Cre]  CBC     Status: None   Collection Time: 09/24/20  4:16 PM  Result Value Ref Range   WBC 8.6 4.0 - 10.5 K/uL   RBC 4.41 3.87 - 5.11 MIL/uL   Hemoglobin 13.4 12.0 - 15.0 g/dL   HCT 11/24/20 91.6 - 38.4 %   MCV 90.9 80.0 - 100.0 fL   MCH 30.4 26.0 - 34.0 pg   MCHC 33.4 30.0 - 36.0 g/dL   RDW 66.5 99.3 - 57.0 %   Platelets 206 150 - 400 K/uL   nRBC 0.0 0.0 - 0.2 %  Comprehensive metabolic panel     Status: Abnormal   Collection Time: 09/24/20  4:16 PM  Result Value Ref Range   Sodium 136 135 - 145 mmol/L   Potassium 4.4 3.5 - 5.1 mmol/L   Chloride 105 98 - 111 mmol/L   CO2 23 22 - 32 mmol/L   Glucose, Bld 108 (H) 70 - 99 mg/dL   BUN 11 6 - 20 mg/dL   Creatinine, Ser 11/24/20 0.44 - 1.00 mg/dL  Calcium 9.3 8.9 - 10.3 mg/dL   Total Protein 6.3 (L) 6.5 - 8.1 g/dL   Albumin 3.0 (L) 3.5 - 5.0 g/dL   AST 26 15 - 41 U/L   ALT 24 0 - 44 U/L   Alkaline Phosphatase 98 38 - 126 U/L   Total Bilirubin 0.6 0.3 - 1.2 mg/dL   GFR, Estimated >54 >56 mL/min   Anion gap 8 5 - 15      *Arlan Organ, CNM in room to see patient @ [phone] - no assumption of care was done at that time. *TC to Arlan Organ, CNM -- she notified RN that she assumed care of the patient when she was on MAU. Assessment and Plan  Care of patient turned over to Arlan Organ, [place] @ [phone] - See her documentation for plan  Raelyn Mora, CNM 09/24/2020, 4:34 PM

## 2020-09-24 NOTE — Discharge Instructions (Signed)
Start taking Evening Primrose Oil 2000 mg per vagina at bedtime. Continue with Nettles and Dandelion and Red Raspberry Leaf tea 3-4 servings per day, add daily beats and dates.  Lots of rest and no renovation work.   Call office with any headaches, blurry vision, or nausea vomiting occurring.

## 2020-09-25 ENCOUNTER — Encounter (HOSPITAL_COMMUNITY): Payer: Self-pay | Admitting: *Deleted

## 2020-09-25 ENCOUNTER — Telehealth (HOSPITAL_COMMUNITY): Payer: Self-pay | Admitting: *Deleted

## 2020-09-25 NOTE — Telephone Encounter (Signed)
Preadmission screen  

## 2020-09-26 ENCOUNTER — Other Ambulatory Visit (HOSPITAL_COMMUNITY): Payer: BC Managed Care – PPO | Attending: Obstetrics and Gynecology

## 2020-09-26 DIAGNOSIS — O133 Gestational [pregnancy-induced] hypertension without significant proteinuria, third trimester: Secondary | ICD-10-CM | POA: Diagnosis not present

## 2020-09-26 DIAGNOSIS — Z3A36 36 weeks gestation of pregnancy: Secondary | ICD-10-CM | POA: Diagnosis not present

## 2020-09-28 ENCOUNTER — Inpatient Hospital Stay (HOSPITAL_COMMUNITY)
Admission: AD | Admit: 2020-09-28 | Discharge: 2020-10-01 | DRG: 807 | Disposition: A | Discharge status: Home / Self Care | Payer: BC Managed Care – PPO | Attending: Obstetrics and Gynecology | Admitting: Obstetrics and Gynecology

## 2020-09-28 ENCOUNTER — Inpatient Hospital Stay (HOSPITAL_COMMUNITY): Payer: BC Managed Care – PPO

## 2020-09-28 ENCOUNTER — Other Ambulatory Visit: Payer: Self-pay

## 2020-09-28 ENCOUNTER — Encounter (HOSPITAL_COMMUNITY): Payer: Self-pay | Admitting: Obstetrics and Gynecology

## 2020-09-28 DIAGNOSIS — Z87891 Personal history of nicotine dependence: Secondary | ICD-10-CM

## 2020-09-28 DIAGNOSIS — Z20822 Contact with and (suspected) exposure to covid-19: Secondary | ICD-10-CM | POA: Diagnosis present

## 2020-09-28 DIAGNOSIS — Z3490 Encounter for supervision of normal pregnancy, unspecified, unspecified trimester: Secondary | ICD-10-CM | POA: Diagnosis present

## 2020-09-28 DIAGNOSIS — O134 Gestational [pregnancy-induced] hypertension without significant proteinuria, complicating childbirth: Principal | ICD-10-CM | POA: Diagnosis present

## 2020-09-28 DIAGNOSIS — O133 Gestational [pregnancy-induced] hypertension without significant proteinuria, third trimester: Secondary | ICD-10-CM | POA: Diagnosis not present

## 2020-09-28 DIAGNOSIS — O43123 Velamentous insertion of umbilical cord, third trimester: Secondary | ICD-10-CM | POA: Diagnosis present

## 2020-09-28 DIAGNOSIS — Z3A37 37 weeks gestation of pregnancy: Secondary | ICD-10-CM

## 2020-09-28 DIAGNOSIS — O139 Gestational [pregnancy-induced] hypertension without significant proteinuria, unspecified trimester: Secondary | ICD-10-CM | POA: Diagnosis present

## 2020-09-28 LAB — CBC
HCT: 36.9 % (ref 36.0–46.0)
Hemoglobin: 12.9 g/dL (ref 12.0–15.0)
MCH: 30.9 pg (ref 26.0–34.0)
MCHC: 35 g/dL (ref 30.0–36.0)
MCV: 88.3 fL (ref 80.0–100.0)
Platelets: 219 10*3/uL (ref 150–400)
RBC: 4.18 MIL/uL (ref 3.87–5.11)
RDW: 13.2 % (ref 11.5–15.5)
WBC: 9.4 10*3/uL (ref 4.0–10.5)
nRBC: 0 % (ref 0.0–0.2)

## 2020-09-28 LAB — RPR: RPR Ser Ql: NONREACTIVE

## 2020-09-28 LAB — COMPREHENSIVE METABOLIC PANEL
ALT: 27 U/L (ref 0–44)
AST: 32 U/L (ref 15–41)
Albumin: 2.8 g/dL — ABNORMAL LOW (ref 3.5–5.0)
Alkaline Phosphatase: 100 U/L (ref 38–126)
Anion gap: 10 (ref 5–15)
BUN: 12 mg/dL (ref 6–20)
CO2: 19 mmol/L — ABNORMAL LOW (ref 22–32)
Calcium: 9.4 mg/dL (ref 8.9–10.3)
Chloride: 105 mmol/L (ref 98–111)
Creatinine, Ser: 0.64 mg/dL (ref 0.44–1.00)
GFR, Estimated: >60 mL/min (ref >60)
Glucose, Bld: 111 mg/dL — ABNORMAL HIGH (ref 70–99)
Potassium: 3.9 mmol/L (ref 3.5–5.1)
Sodium: 134 mmol/L — ABNORMAL LOW (ref 135–145)
Total Bilirubin: 0.5 mg/dL (ref 0.3–1.2)
Total Protein: 6.1 g/dL — ABNORMAL LOW (ref 6.5–8.1)

## 2020-09-28 LAB — TYPE AND SCREEN
ABO/RH(D): A POS
Antibody Screen: NEGATIVE

## 2020-09-28 MED ORDER — MISOPROSTOL 25 MCG QUARTER TABLET: 25.0000 ug | ORAL_TABLET | ORAL | Status: DC | PRN

## 2020-09-28 MED ORDER — TERBUTALINE SULFATE 1 MG/ML IJ SOLN: 0.2500 mg | Freq: Once | INTRAMUSCULAR | Status: DC | PRN

## 2020-09-28 MED ORDER — MISOPROSTOL 50MCG HALF TABLET
50.0000 ug | ORAL_TABLET | ORAL | Status: DC
  Administered 2020-09-28 (×2): 50 ug via BUCCAL
  Filled 2020-09-28: qty 1

## 2020-09-28 MED ORDER — LACTATED RINGERS IV SOLN: INTRAVENOUS | Status: DC

## 2020-09-28 MED ORDER — OXYTOCIN-SODIUM CHLORIDE 30-0.9 UT/500ML-% IV SOLN
1.0000 m[IU]/min | INTRAVENOUS | Status: DC
  Filled 2020-09-28: qty 500

## 2020-09-28 MED ORDER — MISOPROSTOL 50MCG HALF TABLET
ORAL_TABLET | ORAL | Status: AC
  Filled 2020-09-28: qty 1

## 2020-09-28 MED ORDER — OXYTOCIN-SODIUM CHLORIDE 30-0.9 UT/500ML-% IV SOLN
1.0000 m[IU]/min | INTRAVENOUS | Status: DC
  Administered 2020-09-28: 2 m[IU]/min via INTRAVENOUS
  Filled 2020-09-28: qty 500

## 2020-09-28 NOTE — H&P (Signed)
OB ADMISSION/ HISTORY & PHYSICAL:  Admission Date: 09/28/2020  8:41 AM  Admit Diagnosis: Encounter for induction of labor due to gHTN    Meghan Bridges is a 40 y.o. female at [redacted]w[redacted]d presenting for IOL for gestational hypertension. Transferred to midwifery care late in the 3rd trimester, desires unmedicated labor. At 36 weeks, BP was mild to severe range, PEC labs were WNL, she was started on PO Procardia daily, and scheduled for IOL at 37 weeks. Denies current headache, epigastric pain, or visual changes. Denies contractions, leaking of fluid, or vaginal bleeding. Endorses + fetal movement. Husband, Alycia Rossetti, present and supportive. Doula, Abby, to join in active labor. Eagerly anticipating baby girl "[place]".  Prenatal History: [name]   EDC : 10/19/2020, by Last Menstrual Period  Prenatal care at [facility] since 1st trimester, usual provider Dr. Amado Nash, transferred to midwifery care at 32 weeks.  Prenatal course complicated by: 1. AMA 2. MCI - following growth, AGA 3. gHTN - diagnosed at 36 weeks, on PO Procardia, baseline PEC labs WNL 4. Echogenic bowel on anatomy scan, NIPT low risk, MFM consult with no echogenic bowel  Prenatal Labs: ABO, Rh:   A POS Antibody:   Negative Rubella:   Immune RPR:   Non-reactive HBsAg:   Negative HIV: NON-REACTIVE (07/28 0939)  GBS:   Negative 1 hr Glucola : 131 Genetic Screening: Panorama low risk XX, AFP-1 negative Ultrasound: normal XX anatomy, posterior placenta, MCI, initial echogenic bowel on anatomy scan, now resolved, last EFW 34% at 33 weeks  TDaP          UTD COVID-19 UTD    Maternal Diabetes: No Genetic Screening: Normal Maternal Ultrasounds/Referrals: Normal Fetal Ultrasounds or other Referrals:  Referred to Materal Fetal Medicine  Maternal Substance Abuse:  No Significant Maternal Medications:  None Significant Maternal Lab Results:  Group B Strep negative Other Comments:  None  Medical / Surgical History : Past medical  history:  Past Medical History:  Diagnosis Date  . Pregnancy induced hypertension   . Vaginal Pap smear, abnormal     Past surgical history:  Past Surgical History:  Procedure Laterality Date  . APPENDECTOMY    . WISDOM TOOTH EXTRACTION  2019    Family History:  Family History  Problem Relation Age of Onset  . Heart disease Mother   . Heart disease Father   . Mental illness Maternal Uncle   . Alzheimer's disease Maternal Grandmother   . Cancer Maternal Grandfather     Social History:  reports that she has quit smoking. Her smoking use included cigarettes. She has never used smokeless tobacco. She reports that she does not drink alcohol and does not use drugs. Allergies: Latex   Current Medications at time of admission:  Medications Prior to Admission  Medication Sig Dispense Refill Last Dose  . NIFEdipine (ADALAT CC) 30 MG 24 hr tablet Take 1 tablet (30 mg total) by mouth daily for 30 doses. 30 tablet 0   . Prenatal Vit-Fe Fumarate-FA (PRENATAL MULTIVITAMIN) TABS tablet Take 1 tablet by mouth daily at 12 noon.       Review of Systems: Review of Systems  Eyes: Negative for blurred vision and double vision.  Gastrointestinal: Negative for abdominal pain.  Neurological: Negative for headaches.  All other systems reviewed and are negative.  Physical Exam: Vital signs and nursing notes reviewed.  Patient Vitals for the past 24 hrs:  BP Pulse Height Weight  09/28/20 0942 -- -- 5\' 4"  (1.626 m) 86.2 kg  09/28/20  0900 (!) 116/96 (!) 122 -- --    General: AAO x 3, NAD Heart: RRR Lungs:CTAB Abdomen: Gravid, NT Extremities: no edema Genitalia / VE: 1-2/50/-3, soft  Discussed the R/B/A of Foley balloon placement for labor induction. Patient consents to procedure.   Foley balloon placed without difficulty. 60cc of fluid instilled in balloon and taped to leg for traction. Patient tolerated procedure well.  FHR: 140BPM, mod variability, + accels, no decels TOCO: Contractions  rare  Labs:   Pending T&S, CBC, RPR  Recent Labs    09/28/20 0927  WBC 9.4  HGB 12.9  HCT 36.9  PLT 219    Assessment:  39 y.o. [name] at [redacted]w[redacted]d, gHTN, MCI, AMA  1. Induction of labor 2. FHR category 1 3. GBS negative 4. Desires unmedicated labor, has doula support 5. Plans to breastfeed 6. GHTN, on PO Procardia x 3 days, baseline PEC labs WNL 7. MCI - last growth at 33 weeks, 34% 8. AMA  Plan:  1. Admit to BS 2. Routine L&D orders 3. Analgesia/anesthesia PRN  4. PEC labs on admission, monitor BP closely 5. Foley balloon placed for cervical ripening 6. Buccal Cytotec q 4 hours 7. Will consider AROM in active labor 8. Anticipate NSVB  Dr. Amado Nash notified of admission/plan of care.  June Leap CNM, MSN 09/28/2020, 10:31 AM

## 2020-09-28 NOTE — Progress Notes (Signed)
S: Resting comfortably. Feeling mild contractions. Husband, Alycia Rossetti, at the bedside.   O: Vitals:   09/28/20 1112 09/28/20 1250 09/28/20 1544 09/28/20 1814  BP: (!) 140/92 (!) 129/91 (!) 140/94 (!) 140/106  Pulse: 79 82 84 (!) 105  Weight:      Height:       FHT:  FHR: 140 bpm, variability: moderate,  accelerations:  Present,  decelerations:  Absent UC:   regular, every 2-3 minutes SVE:   Dilation: 5 Effacement (%): 50 Station: Ballotable Exam by:: Dorisann Frames CNM   A / P: Induction of labor due to gestational hypertension, s/p 2 doses of buccal Cytotec and Foley balloon for cervical ripening  Fetal Wellbeing:  Category I Pain Control:  Labor support without medications  PEC: Labs stable on admission, BPs mild range, denies PEC symptoms Anticipated MOD:  NSVD   Patient desires to eat dinner. After eating light laboring diet, will start Pitocin 2x2 until adequate contractions. Consider AROM when fetal vertex is well applied.   Dr. Billy Coast updated on patient status and plan of care.   June Leap, CNM, MSN 09/28/2020, 6:35 PM

## 2020-09-28 NOTE — Plan of Care (Signed)

## 2020-09-29 ENCOUNTER — Inpatient Hospital Stay (HOSPITAL_COMMUNITY): Payer: BC Managed Care – PPO | Admitting: Anesthesiology

## 2020-09-29 ENCOUNTER — Inpatient Hospital Stay (HOSPITAL_COMMUNITY): Payer: BC Managed Care – PPO

## 2020-09-29 ENCOUNTER — Encounter (HOSPITAL_COMMUNITY): Payer: Self-pay | Admitting: Obstetrics and Gynecology

## 2020-09-29 ENCOUNTER — Inpatient Hospital Stay (HOSPITAL_COMMUNITY)
Admission: AD | Admit: 2020-09-29 | Payer: BC Managed Care – PPO | Source: Home / Self Care | Admitting: Obstetrics and Gynecology

## 2020-09-29 DIAGNOSIS — O139 Gestational [pregnancy-induced] hypertension without significant proteinuria, unspecified trimester: Secondary | ICD-10-CM | POA: Diagnosis present

## 2020-09-29 LAB — CBC
HCT: 39.5 % (ref 36.0–46.0)
HCT: 37.2 % (ref 36.0–46.0)
Hemoglobin: 13.5 g/dL (ref 12.0–15.0)
Hemoglobin: 12.3 g/dL (ref 12.0–15.0)
MCH: 30.5 pg (ref 26.0–34.0)
MCH: 30.1 pg (ref 26.0–34.0)
MCHC: 34.2 g/dL (ref 30.0–36.0)
MCHC: 33.1 g/dL (ref 30.0–36.0)
MCV: 89.2 fL (ref 80.0–100.0)
MCV: 91.2 fL (ref 80.0–100.0)
Platelets: 191 10*3/uL (ref 150–400)
Platelets: 193 10*3/uL (ref 150–400)
RBC: 4.43 MIL/uL (ref 3.87–5.11)
RBC: 4.08 MIL/uL (ref 3.87–5.11)
RDW: 13.4 % (ref 11.5–15.5)
RDW: 13.5 % (ref 11.5–15.5)
WBC: 22.2 10*3/uL — ABNORMAL HIGH (ref 4.0–10.5)
WBC: 18.7 10*3/uL — ABNORMAL HIGH (ref 4.0–10.5)
nRBC: 0 % (ref 0.0–0.2)
nRBC: 0 % (ref 0.0–0.2)

## 2020-09-29 LAB — RESP PANEL BY RT-PCR (FLU A&B, COVID) ARPGX2
Influenza A by PCR: NEGATIVE
Influenza B by PCR: NEGATIVE
SARS Coronavirus 2 by RT PCR: NEGATIVE

## 2020-09-29 MED ORDER — OXYTOCIN-SODIUM CHLORIDE 30-0.9 UT/500ML-% IV SOLN: 2.5000 [IU]/h | INTRAVENOUS | Status: DC

## 2020-09-29 MED ORDER — EPHEDRINE 5 MG/ML INJ: 10.0000 mg | INTRAVENOUS | Status: DC | PRN

## 2020-09-29 MED ORDER — SOD CITRATE-CITRIC ACID 500-334 MG/5ML PO SOLN: 30.0000 mL | ORAL | Status: DC | PRN

## 2020-09-29 MED ORDER — OXYCODONE-ACETAMINOPHEN 5-325 MG PO TABS
2.0000 | ORAL_TABLET | ORAL | Status: DC | PRN
Start: 2020-09-29 — End: 2020-09-29

## 2020-09-29 MED ORDER — SIMETHICONE 80 MG PO CHEW: 80.0000 mg | CHEWABLE_TABLET | ORAL | Status: DC | PRN

## 2020-09-29 MED ORDER — LACTATED RINGERS IV SOLN: 500.0000 mL | Freq: Once | INTRAVENOUS | Status: DC

## 2020-09-29 MED ORDER — LACTATED RINGERS IV SOLN: INTRAVENOUS | Status: DC

## 2020-09-29 MED ORDER — LIDOCAINE HCL (PF) 1 % IJ SOLN
30.0000 mL | INTRAMUSCULAR | Status: AC | PRN
  Administered 2020-09-29: 30 mL via SUBCUTANEOUS
  Filled 2020-09-29: qty 30

## 2020-09-29 MED ORDER — ZOLPIDEM TARTRATE 5 MG PO TABS: 5.0000 mg | ORAL_TABLET | Freq: Every evening | ORAL | Status: DC | PRN

## 2020-09-29 MED ORDER — ONDANSETRON HCL 4 MG/2ML IJ SOLN: 4.0000 mg | INTRAMUSCULAR | Status: DC | PRN

## 2020-09-29 MED ORDER — FENTANYL-BUPIVACAINE-NACL 0.5-0.125-0.9 MG/250ML-% EP SOLN
EPIDURAL | Status: DC | PRN
  Administered 2020-09-29: 12 mL/h via EPIDURAL

## 2020-09-29 MED ORDER — ACETAMINOPHEN 325 MG PO TABS: 650.0000 mg | ORAL_TABLET | ORAL | Status: DC | PRN

## 2020-09-29 MED ORDER — FENTANYL-BUPIVACAINE-NACL 0.5-0.125-0.9 MG/250ML-% EP SOLN: 12.0000 mL/h | EPIDURAL | Status: DC | PRN

## 2020-09-29 MED ORDER — FLEET ENEMA 7-19 GM/118ML RE ENEM: 1.0000 | ENEMA | RECTAL | Status: DC | PRN

## 2020-09-29 MED ORDER — PHENYLEPHRINE 40 MCG/ML (10ML) SYRINGE FOR IV PUSH (FOR BLOOD PRESSURE SUPPORT): 80.0000 ug | PREFILLED_SYRINGE | INTRAVENOUS | Status: DC | PRN

## 2020-09-29 MED ORDER — SENNOSIDES-DOCUSATE SODIUM 8.6-50 MG PO TABS
2.0000 | ORAL_TABLET | Freq: Every day | ORAL | Status: DC
  Administered 2020-09-30: 2 via ORAL
  Filled 2020-09-29: qty 2

## 2020-09-29 MED ORDER — IBUPROFEN 600 MG PO TABS
600.0000 mg | ORAL_TABLET | Freq: Four times a day (QID) | ORAL | Status: DC
  Administered 2020-09-29 – 2020-10-01 (×7): 600 mg via ORAL
  Filled 2020-09-29 (×7): qty 1

## 2020-09-29 MED ORDER — BENZOCAINE-MENTHOL 20-0.5 % EX AERO
1.0000 "application " | INHALATION_SPRAY | CUTANEOUS | Status: DC | PRN
  Administered 2020-09-30 – 2020-10-01 (×2): 1 via TOPICAL
  Filled 2020-09-29 (×2): qty 56

## 2020-09-29 MED ORDER — DIPHENHYDRAMINE HCL 50 MG/ML IJ SOLN: 12.5000 mg | INTRAMUSCULAR | Status: DC | PRN

## 2020-09-29 MED ORDER — PRENATAL MULTIVITAMIN CH
1.0000 | ORAL_TABLET | Freq: Every day | ORAL | Status: DC
  Administered 2020-09-30 – 2020-10-01 (×2): 1 via ORAL
  Filled 2020-09-29 (×2): qty 1

## 2020-09-29 MED ORDER — LIDOCAINE HCL (PF) 1 % IJ SOLN
INTRAMUSCULAR | Status: DC | PRN
  Administered 2020-09-29: 5 mL via EPIDURAL
  Administered 2020-09-29: 4 mL via EPIDURAL

## 2020-09-29 MED ORDER — DIBUCAINE (PERIANAL) 1 % EX OINT: 1.0000 "application " | TOPICAL_OINTMENT | CUTANEOUS | Status: DC | PRN

## 2020-09-29 MED ORDER — ONDANSETRON HCL 4 MG/2ML IJ SOLN: 4.0000 mg | Freq: Four times a day (QID) | INTRAMUSCULAR | Status: DC | PRN

## 2020-09-29 MED ORDER — TETANUS-DIPHTH-ACELL PERTUSSIS 5-2.5-18.5 LF-MCG/0.5 IM SUSY: 0.5000 mL | PREFILLED_SYRINGE | Freq: Once | INTRAMUSCULAR | Status: DC

## 2020-09-29 MED ORDER — OXYTOCIN BOLUS FROM INFUSION
333.0000 mL | Freq: Once | INTRAVENOUS | Status: AC
  Administered 2020-09-29: 333 mL via INTRAVENOUS

## 2020-09-29 MED ORDER — DIPHENHYDRAMINE HCL 25 MG PO CAPS: 25.0000 mg | ORAL_CAPSULE | Freq: Four times a day (QID) | ORAL | Status: DC | PRN

## 2020-09-29 MED ORDER — ONDANSETRON HCL 4 MG PO TABS: 4.0000 mg | ORAL_TABLET | ORAL | Status: DC | PRN

## 2020-09-29 MED ORDER — COCONUT OIL OIL
1.0000 "application " | TOPICAL_OIL | Status: DC | PRN
  Administered 2020-10-01: 1 via TOPICAL

## 2020-09-29 MED ORDER — LACTATED RINGERS IV SOLN: 500.0000 mL | INTRAVENOUS | Status: DC | PRN

## 2020-09-29 MED ORDER — OXYCODONE-ACETAMINOPHEN 5-325 MG PO TABS: 1.0000 | ORAL_TABLET | ORAL | Status: DC | PRN

## 2020-09-29 MED ORDER — FENTANYL-BUPIVACAINE-NACL 0.5-0.125-0.9 MG/250ML-% EP SOLN
EPIDURAL | Status: AC
  Filled 2020-09-29: qty 250

## 2020-09-29 MED ORDER — LIDOCAINE HCL (PF) 1 % IJ SOLN
INTRAMUSCULAR | Status: AC
  Filled 2020-09-29: qty 30

## 2020-09-29 MED ORDER — WITCH HAZEL-GLYCERIN EX PADS
1.0000 "application " | MEDICATED_PAD | CUTANEOUS | Status: DC | PRN
  Administered 2020-10-01: 1 via TOPICAL

## 2020-09-29 NOTE — Anesthesia Preprocedure Evaluation (Addendum)
Anesthesia Evaluation  Patient identified by MRN, date of birth, ID band Patient awake    Reviewed: Allergy & Precautions, NPO status , Patient's Chart, lab work & pertinent test results  History of Anesthesia Complications Negative for: history of anesthetic complications  Airway Mallampati: II  TM Distance: >3 FB Neck ROM: Full    Dental   Pulmonary former smoker,    Pulmonary exam normal        Cardiovascular hypertension (PIH), Pt. on medications Normal cardiovascular exam     Neuro/Psych negative neurological ROS  negative psych ROS   GI/Hepatic negative GI ROS, Neg liver ROS,   Endo/Other   Obesity   Renal/GU negative Renal ROS     Musculoskeletal negative musculoskeletal ROS (+)   Abdominal   Peds  Hematology negative hematology ROS (+)  Plt 191k    Anesthesia Other Findings   Reproductive/Obstetrics (+) Pregnancy                            Anesthesia Physical Anesthesia Plan  ASA: II  Anesthesia Plan: Epidural   Post-op Pain Management:    Induction:   PONV Risk Score and Plan: 2 and Treatment may vary due to age or medical condition  Airway Management Planned: Natural Airway  Additional Equipment: None  Intra-op Plan:   Post-operative Plan:   Informed Consent: I have reviewed the patients History and Physical, chart, labs and discussed the procedure including the risks, benefits and alternatives for the proposed anesthesia with the patient or authorized representative who has indicated his/her understanding and acceptance.       Plan Discussed with: Anesthesiologist  Anesthesia Plan Comments: (Labs reviewed. Platelets acceptable, patient not taking any blood thinning medications. Per RN, FHR tracing reported to be stable enough for sitting procedure. Risks and benefits discussed with patient, including PDPH, backache, epidural hematoma, failed epidural,  blood pressure changes, allergic reaction, and nerve injury. Patient expressed understanding and wished to proceed.)       Anesthesia Quick Evaluation

## 2020-09-29 NOTE — Progress Notes (Signed)
S: Breathing through contractions with doula. Requesting epidural.   O: Vitals:   09/29/20 0000 09/29/20 0126 09/29/20 0543 09/29/20 0721  BP: (!) 139/96 (!) 149/99 (!) 139/97 (!) 131/93  Pulse: 95 96 (!) 108 (!) 107  Resp: 17 18 18 18   Temp:   98.7 F (37.1 C) 98.6 F (37 C)  TempSrc:    Oral  Weight:      Height:       FHT:  FHR: 150 bpm, variability: moderate,  accelerations:  Present,  decelerations:  Absent UC:   regular, every 2-3 minutes SVE:   Dilation: 6 Effacement (%): 70 Station: -2 Exam by:: Charbel Los,cnm  A / P: Induction of labor due to gestational hypertension, s/p 2 doses of buccal Cytotec and Foley balloon for cervical ripening, Pitocin infusing at 12 mu  Fetal Wellbeing:  Category I Pain Control:  Labor support without medications Anticipated MOD:  NSVD   Patient will get epidural.   Sign out given to oncoming CNM.  [phone], CNM, MSN 09/29/2020, 8:44 AM

## 2020-09-29 NOTE — Progress Notes (Addendum)
Meghan Bridges is a 40 y.o. [name] at [redacted]w[redacted]d by ultrasound admitted for induction of labor at 37 weeks for gestational hypertension. She is s/p 2 doses of buccal Cytotec and Foley balloon for cervical ripening, Pitocin infusing at 12 mu. I assumed care from CNM at 8:45am and have been at bedside intermittently providing support. Spouse and doula at bedside, supportive. Pt. Received epidural at 9:50am.   Subjective: Resting comfortably and happy with decision to get the epidural. Discussed r/b of IUPC placement if no cervical change and patient agrees.   Objective: Vitals:   09/29/20 1016 09/29/20 1021 09/29/20 1031 09/29/20 1101  BP: 127/89 127/83 (!) 126/94 118/69  Pulse: (!) 113 (!) 109 (!) 127 (!) 101  Resp: 20  16 20   Temp:      TempSrc:      SpO2:      Weight:      Height:        No intake/output data recorded. No intake/output data recorded.   FHT:  FHR: 140 bpm, variability: moderate,  accelerations:  Present,  decelerations:  Present occasional variable and late decelerations UC:   regular, every 2-4 minutes SVE:   Dilation: 6 Effacement (%): 70 Station: -2 Exam by:: Anivea Velasques,cnm  Molding noted.  IUPC placed without difficulty   Labs:   Recent Labs    09/28/20 0927 09/29/20 0843  WBC 9.4 22.2*  HGB 12.9 13.5  HCT 36.9 39.5  PLT 219 191    Assessment / Plan: [name] 40 y.o. [redacted]w[redacted]d Induction of labor due to gestational hypertension,  progressing well on pitocin  Labor: Protracted active phase  - Plan to increase Pitocin to achieve MVUs >200 if late decelerations resolve Preeclampsia:  no signs or symptoms of toxicity and labs stable Fetal Wellbeing:  Category II  - IV fluid bolus infusing now  Leukocytosis noted on CBC this morning, watch closely for s/s of chorio Pain Control:  Epidural  Anticipated MOD:  Guarded, working towards NSVD  Dr. [redacted]w[redacted]d updated on patient status and plan of care  Billy Coast, CNM, MSN 09/29/2020, 11:14 AM

## 2020-09-29 NOTE — Progress Notes (Signed)
S: Feeling more intense contractions. Able to breathe through them. Husband, Meghan Bridges, supportive. Discussed the R/B/A of AROM for labor induction and patient consents to procedure.   O: Vitals:   09/28/20 2100 09/28/20 2200 09/29/20 0000 09/29/20 0126  BP: 128/88 (!) 142/90 (!) 139/96 (!) 149/99  Pulse: (!) 106 91 95 96  Resp: 17 17 17 18   Temp:      TempSrc:      Weight:      Height:       FHT:  FHR: 150 bpm, variability: moderate,  accelerations:  Present,  decelerations:  Absent UC:   regular, every 1.5-2.5 minutes SVE:   Dilation: 6 Effacement (%): 60,70 Station: -2 Exam by:: [email or web address], CNM   AROM of a small amount of clear fluid at 0311.  A / P: Induction of labor due to gestational hypertension, s/p 2 doses of buccal Cytotec and Foley balloon for cervical ripening, Pitocin infusing at 10 mu  Fetal Wellbeing:  Category I Pain Control:  Labor support without medications Anticipated MOD:  NSVD   Continue to increase Pitocin until active labor is achieved. Patient may have IV pain medication or epidural as requested.   Dorisann Frames, CNM, MSN 09/29/2020, 3:21 AM

## 2020-09-29 NOTE — Lactation Note (Addendum)
This note was copied from a baby's chart. Lactation Consultation Note Baby 6 hrs old at time of consult. Attempted several times to latch baby. Baby wouldn't suckle at breast. Baby will suck on gloved finger.  Mom has Rt. Inverted nipple that the center is inverted but nipple everts w/stimulation. Baby would be able to latch if she would but has no interest at this time.  Baby has recessed chin that needs chin tug to widen flange. Baby is fussy. Fights when first put to breast, the  Will hold it in her mouth then loose width and hold of nipple.  Mom tried in football, cradle, and cross cradle position. Mom is bale to do hand expression. LC worked w/mom on hand expression collected 2 ml colostrum. Gave foley cup to keep better amount of how much colostrum baby is getting.  FOB wanted to hold baby STS.  Mom shown how to use DEBP & how to disassemble, clean, & reassemble parts. Mom knows to pump q3h for 15-20 min. Mom didn't get anything w/pumping this time.  Newborn feeding habits, behavior, STS, I&O, breast massage, positioning, support, milk storage, supply and demand discussed. Mom encouraged to feed baby 8-12 times/24 hours and with feeding cues.   Baby is 37 1/7 wks. Wt. 6.1 lbs. So LPI information given as ETI and reviewed w/mom. Praised mom for her good colostrum and encouraged to continue to give that. Suggested DBM if baby needs to be supplemented w/more. Mom interested  Plan for now:  Mom is to wear shells in am between feedings. Suggested mom pre-pump to evert nipples before latching. Either w/DEBP or w/hand pump attachment. Hand express in foley cup to give baby colostrum after feeding d/t LBW. Mom may want to give some DM to increase supplemental intake if needed. Alert RN if jitteriness increases.   Patient Name: Meghan Bridges [id number] Date: 09/29/2020 Reason for consult: Initial assessment;[place] Age:55 hours  Maternal Data Has patient been taught Hand  Expression?: Yes Does the patient have breastfeeding experience prior to this delivery?: No  Feeding    LATCH Score Latch: Too sleepy or reluctant, no latch achieved, no sucking elicited.  Audible Swallowing: None  Type of Nipple: Inverted  Comfort (Breast/Nipple): Soft / non-tender  Hold (Positioning): Full assist, staff holds infant at breast  LATCH Score: 2   Lactation Tools Discussed/Used Tools: Shells;Pump Breast pump type: Double-Electric Breast Pump Pump Education: Setup, frequency, and cleaning;Milk Storage Reason for Pumping: inverted nipples Pumping frequency: pre-pump  Interventions Interventions: Breast feeding basics reviewed;Support pillows;Assisted with latch;Position options;Skin to skin;Expressed milk;Breast massage;Hand express;Shells;Pre-pump if needed;DEBP;Breast compression;Adjust position  Discharge    Consult Status Consult Status: Follow-up Date: 09/30/20 Follow-up type: In-patient    Charyl Dancer 09/29/2020, 11:34 PM

## 2020-09-29 NOTE — Anesthesia Procedure Notes (Signed)
Epidural Patient location during procedure: OB Start time: 09/29/2020 9:46 AM End time: 09/29/2020 9:50 AM  Staffing Anesthesiologist: Beryle Lathe, MD Performed: anesthesiologist   Preanesthetic Checklist Completed: patient identified, IV checked, risks and benefits discussed, monitors and equipment checked, pre-op evaluation and timeout performed  Epidural Patient position: sitting Prep: DuraPrep Patient monitoring: continuous pulse ox and blood pressure Approach: midline Location: L2-L3 Injection technique: LOR saline  Needle:  Needle type: Tuohy  Needle gauge: 17 G Needle length: 9 cm Needle insertion depth: 5.5 cm Catheter size: 19 Gauge Catheter at skin depth: 10 cm Test dose: negative and Other (1% lidocaine)  Assessment Events: blood not aspirated  Additional Notes Patient identified. Risks including, but not limited to, bleeding, infection, nerve damage, paralysis, inadequate analgesia, blood pressure changes, nausea, vomiting, allergic reaction, postpartum back pain, itching, and headache were discussed. Patient expressed understanding and wished to proceed. Sterile prep and drape, including hand hygiene, mask, and sterile gloves were used. The patient was positioned and the spine was prepped. The skin was anesthetized with lidocaine. No paraesthesia or other complication noted. The patient did not experience any signs of intravascular injection such as tinnitus or metallic taste in mouth, nor signs of intrathecal spread such as rapid motor block. Please see nursing notes for vital signs. The patient tolerated the procedure well.   Leslye Peer, MDReason for block:procedure for pain

## 2020-09-29 NOTE — Lactation Note (Signed)
This note was copied from a baby's chart. Lactation Consultation Note  Patient Name: Meghan Bridges Date: 09/29/2020 Reason for consult: L&D Initial assessment;Primapara;1st time breastfeeding;Early term 37-38.6wks Age:38 hours  L&D consult with >60 minutes old infant and P1 mother. Parents and doula are present at time of consult. Congratulated them on newborn. Newborn is skin to skin prone on mother's chest. Discussed STS as ideal transition for infants after birth helping with temperature, blood sugar and comfort. Talked about primal reflexes such as rooting, hands to mouth, searching for the breast among others.   Assisted with latch to left breast, laid back position. Infant suckles but does not sustain latch. Explained LC services availability during postpartum stay. Thanked family for their time.   Maternal Data Has patient been taught Hand Expression?: Yes Does the patient have breastfeeding experience prior to this delivery?: No  Feeding Mother's Current Feeding Choice: Breast Milk  LATCH Score Latch: Repeated attempts needed to sustain latch, nipple held in mouth throughout feeding, stimulation needed to elicit sucking reflex.  Audible Swallowing: A few with stimulation  Type of Nipple: Flat (R-nipple is inverted; L-nipple is flat)  Comfort (Breast/Nipple): Soft / non-tender  Hold (Positioning): Assistance needed to correctly position infant at breast and maintain latch.  LATCH Score: 6  Interventions Interventions: Assisted with latch;Skin to skin;Hand express;Breast massage;Expressed milk;Education  Consult Status Consult Status: Follow-up Date: 09/29/20 Follow-up type: In-patient    Aedan Geimer A Higuera Ancidey 09/29/2020, 4:48 PM

## 2020-09-30 LAB — CBC
HCT: 33.9 % — ABNORMAL LOW (ref 36.0–46.0)
Hemoglobin: 11.2 g/dL — ABNORMAL LOW (ref 12.0–15.0)
MCH: 30.1 pg (ref 26.0–34.0)
MCHC: 33 g/dL (ref 30.0–36.0)
MCV: 91.1 fL (ref 80.0–100.0)
Platelets: 182 10*3/uL (ref 150–400)
RBC: 3.72 MIL/uL — ABNORMAL LOW (ref 3.87–5.11)
RDW: 13.7 % (ref 11.5–15.5)
WBC: 15.9 10*3/uL — ABNORMAL HIGH (ref 4.0–10.5)
nRBC: 0 % (ref 0.0–0.2)

## 2020-09-30 LAB — COMPREHENSIVE METABOLIC PANEL
ALT: 24 U/L (ref 0–44)
AST: 30 U/L (ref 15–41)
Albumin: 2.3 g/dL — ABNORMAL LOW (ref 3.5–5.0)
Alkaline Phosphatase: 82 U/L (ref 38–126)
Anion gap: 7 (ref 5–15)
BUN: 10 mg/dL (ref 6–20)
CO2: 22 mmol/L (ref 22–32)
Calcium: 9 mg/dL (ref 8.9–10.3)
Chloride: 108 mmol/L (ref 98–111)
Creatinine, Ser: 0.87 mg/dL (ref 0.44–1.00)
GFR, Estimated: >60 mL/min (ref >60)
Glucose, Bld: 103 mg/dL — ABNORMAL HIGH (ref 70–99)
Potassium: 3.8 mmol/L (ref 3.5–5.1)
Sodium: 137 mmol/L (ref 135–145)
Total Bilirubin: 0.5 mg/dL (ref 0.3–1.2)
Total Protein: 5.3 g/dL — ABNORMAL LOW (ref 6.5–8.1)

## 2020-09-30 MED ORDER — NIFEDIPINE ER OSMOTIC RELEASE 30 MG PO TB24
30.0000 mg | ORAL_TABLET | Freq: Every day | ORAL | Status: DC
  Administered 2020-09-30 – 2020-10-01 (×2): 30 mg via ORAL
  Filled 2020-09-30 (×2): qty 1

## 2020-09-30 NOTE — Progress Notes (Signed)
PPD # 1 S/P NSVD  Live born female  Birth Weight: 6 lb 1.2 oz (2756 g) APGAR: 8, 9  Newborn Delivery   Birth date/time: 09/29/2020 15:43:00 Delivery type: Vaginal, Spontaneous     Baby name: Hillary Bow Delivering provider: Carlean Jews C  Episiotomy:None   Lacerations:2nd degree;Perineal   Feeding: breast  Pain control at delivery: Epidural   S:  Reports feeling well overall. Pain is minimal and bleeding is small. Breastfeeding with the help of lactation support. Denies PEC symptoms. Husband, Alycia Rossetti, at the bedside.              Tolerating PO/No nausea or vomiting             Bleeding is light             Pain controlled with acetaminophen and ibuprofen (OTC)             Up ad lib/ambulatory/voiding without difficulties   O:  A & O x 3, in no apparent distress              VS:  Vitals:   09/29/20 1957 09/29/20 2049 09/30/20 0049 09/30/20 0423  BP: 137/85 (!) 143/94 118/82 (!) 135/97  Pulse: (!) 103 96 (!) 110 82  Resp:  18 18 18   Temp: 98.9 F (37.2 C) 98.3 F (36.8 C) 98.3 F (36.8 C) 97.8 F (36.6 C)  TempSrc:  Oral Oral Oral  SpO2:   99% 97%  Weight:      Height:        LABS:  Recent Labs    09/29/20 1621 09/30/20 0540  WBC 18.7* 15.9*  HGB 12.3 11.2*  HCT 37.2 33.9*  PLT 193 182    Blood type: --/--/A POS (05/15 0930)  Rubella:     I&O: I/O last 3 completed shifts: In: -  Out: 750 [Urine:600; Blood:150]          No intake/output data recorded.  Vaccines: TDaP          UTD                    COVID-19 UTD   Gen: AAO x 3, NAD  Abdomen: soft, non-tender, non-distended             Fundus: firm, non-tender, U-2  Perineum: repair intact, no edema  Lochia: small  Extremities: no edema, no calf pain or tenderness   A/P:  PPD # 1 39 y.o., 11-01-2000  Principal Problem:   Postpartum care following vaginal delivery (5/16)  Doing well - stable status  Routine post partum orders Active Problems:   Gestational hypertension  Admission labs WNL  Mild range  BP postpartum  Denies PEC symptoms  Restart Procardia 30mg  XL daily  Close PP F/U for blood pressure   Encounter for induction of labor due to gHTN   Spontaneous vaginal delivery   Second degree perineal laceration  Discussed perineal care and comfort measures.  Anticipate discharge tomorrow.   03-17-1970, MSN, CNM 09/30/2020, 11:06 AM

## 2020-09-30 NOTE — Lactation Note (Signed)
This note was copied from a baby's chart. Lactation Consultation Note  Patient Name: Meghan Bridges CWCBJ'[id number] Date: 09/30/2020 Reason for consult: Follow-up assessment;Early term 37-38.6wks;Infant < 6lbs;Primapara;1st time breastfeeding;Mother's request Age:38 hours  Mom called out for latch assistance, mom voiced that previous LC helped her with the latch but that baby unlatched shortly after and got frustrated. Baby already crying when entered the room, offered assistance with re-latching but mom said she just wanted to give baby a bottle.   She brought her own Dr. Manson Passey bottles with # 1 nipples to the hospital and those are the ones she's using to supplement with donor milk. She just started pumping today, and said she'll start doing it consistently. After baby fed about 10 ml of donor milk, she wasn't interested in latching at the breast, she won't even open her mouth when teasing her with the nipple on her nose. Asked mom to call for assistance when needed.  Reviewed normal newborn behavior, cluster feeding, feeding cues, supplementation guidelines for LPIs, pumping schedule and lactogenesis II.  Feeding plan:  1. Encouraged mom to continue putting baby to breast 8-12 times/24 hours or sooner if feeding cues are present 2. She'll start pumping consistently every 3 hours 3. Parents will continue supplementing with EBM/donor milk after feedings at the breast every 3 hours  FOB present and supportive. Parents reported all questions and concerns were answered, they're both aware of LC OP services and will call PRN.  Maternal Data    Feeding Mother's Current Feeding Choice: Breast Milk and Donor Milk  LATCH Score                    Lactation Tools Discussed/Used Tools: Pump;Shells Breast pump type: Double-Electric Breast Pump Pump Education: Setup, frequency, and cleaning Reason for Pumping: ETI < 6 lbs on donor milk Pumping frequency: q 3 hours Pumped volume: 2  mL  Interventions Interventions: Breast feeding basics reviewed;Assisted with latch;Skin to skin;Breast massage;Hand express;Breast compression;Adjust position;Support pillows;DEBP  Discharge Pump: DEBP;Personal  Consult Status Consult Status: Follow-up Date: 10/01/20 Follow-up type: In-patient    Jakarius Flamenco Venetia Constable 09/30/2020, 8:55 PM

## 2020-09-30 NOTE — Anesthesia Postprocedure Evaluation (Signed)
Anesthesia Post Note  Patient: Meghan Bridges  Procedure(s) Performed: AN AD HOC LABOR EPIDURAL     Patient location during evaluation: Mother Baby Anesthesia Type: Epidural Level of consciousness: awake and alert Pain management: pain level controlled Vital Signs Assessment: post-procedure vital signs reviewed and stable Respiratory status: spontaneous breathing, nonlabored ventilation and respiratory function stable Cardiovascular status: stable Postop Assessment: no headache, no backache and epidural receding Anesthetic complications: no   No complications documented.  Last Vitals:  Vitals:   09/30/20 0049 09/30/20 0423  BP: 118/82 (!) 135/97  Pulse: (!) 110 82  Resp: 18 18  Temp: 36.8 C 36.6 C  SpO2: 99% 97%    Last Pain:  Vitals:   09/30/20 0715  TempSrc:   PainSc: Asleep   Pain Goal:                   [employer]

## 2020-09-30 NOTE — Lactation Note (Signed)
This note was copied from a baby's chart. Lactation Consultation Note  Patient Name: Meghan Bridges [id number] Date: 09/30/2020 Reason for consult: Follow-up assessment;Early term 37-38.6wks;Primapara;1st time breastfeeding Age:38 hours   P1 mother whose infant is now 52 hours old.  This is an ETI at 37+1 weeks.  Mother is breast feeding and has started supplementing with donor breast milk due to gestational age and size.  Baby last fed approximately 3 hours ago.  Offered to assist with waking and feeding.  Mother agreeable.  Discussed basic newborn care including characteristics of an early term infant and feeding plan.  Taught hand expression and mother able to express a few colostrum drops which I finger fed back to baby.  Assisted to latch to the left breast in the football hold.  Baby latched but showed no interest in sucking.  Demonstrated breast compressions and gentle stimulation.  She did not respond to gentle stimulation for sucking.  After 10 minutes removed her from the breast.  Assisted father with obtaining donor breast milk.  Reviewed supplementation guidelines using the LPTI sheet.  Baby consumed 10 mls easily with mother's personal bottle from home.  Burped and father held STS.  Offered to initiate the DEBP while father held baby.  Discussed benefits to beginning to pump.  Mother very receptive and eager to begin.  Pump parts, assembly, disassembly and cleaning reviewed.  Observed mother pumping for approximately 10 minutes and the #24 flange size is appropriate.  Mother aware to observe pumping to be sure the flange size is the correct fit.    Baby will feed at least every three hours at the breast followed by supplementation per guidelines.  Mother will pump for 15 minutes after breast feeding.  Encouraged to call her RN/LC for latch assistance as needed.  Father very helpful with newborn care.  Mother had ordered a DEBP from her insurance company.  The pump is one I don't  recognize and it just shipped yesterday from [place].  I suggested considering the pump rental option from the gift shop in order to obtain a high quality pump for this early term baby.  Parents definitely seem interested in pump rental from the gift shop.  Discussed price with them.  They will need guidance on how to complete this process closer to discharge.  Parents appreciative of help provided.   Maternal Data Has patient been taught Hand Expression?: Yes Does the patient have breastfeeding experience prior to this delivery?: No  Feeding Mother's Current Feeding Choice: Breast Milk and Donor Milk  LATCH Score Latch: Too sleepy or reluctant, no latch achieved, no sucking elicited.  Audible Swallowing: None  Type of Nipple: Everted at rest and after stimulation (short shafted)  Comfort (Breast/Nipple): Soft / non-tender  Hold (Positioning): Assistance needed to correctly position infant at breast and maintain latch.  LATCH Score: 5   Lactation Tools Discussed/Used Tools: Pump;Flanges Flange Size: 27;24 Breast pump type: Double-Electric Breast Pump;Manual (Reviewed) Pump Education: Setup, frequency, and cleaning;Milk Storage Reason for Pumping: Breast stimulation for early term infant Pumping frequency: Every three hours  Interventions Interventions: Breast feeding basics reviewed;Assisted with latch;Skin to skin;Breast massage;Hand express;Breast compression;Adjust position;DEBP;Hand pump;Shells;Position options;Support pillows;Education  Discharge Pump: DEBP;Manual;Personal (Pumped shipped yesterday from [place]) [facility] Program: No  Consult Status Consult Status: Follow-up Date: 10/01/20 Follow-up type: In-patient    Dora Sims 09/30/2020, 2:36 PM

## 2020-10-01 MED ORDER — IBUPROFEN 600 MG PO TABS: 600.0000 mg | ORAL_TABLET | Freq: Four times a day (QID) | ORAL | 0 refills | Status: AC

## 2020-10-01 MED ORDER — ACETAMINOPHEN 325 MG PO TABS: 650.0000 mg | ORAL_TABLET | ORAL | 1 refills | Status: AC | PRN

## 2020-10-01 NOTE — Lactation Note (Signed)
This note was copied from a baby's chart. Lactation Consultation Note  Patient Name: Meghan Bridges [id number] Date: 10/01/2020 Reason for consult: Follow-up assessment Age:38 hours   P1, Mother is continuing to have difficulty with latching infant . She reports that infant falls asleep at the breast after a few mins of breastfeeding.  Mother reports that she has had lots of assistance with several LC while in the hospital.  Mother plans to see [facility] [place] for continued assistance.  Encouraged mother to continue to see LC as long as needed.   Discussed treatment and prevention of engorgement.  .  Mother reports that she plans to rent a Symphony pump from the hospital for one month.  Plan of Care : Breastfeed infant with feeding cues Supplement infant with ebm/DBM/formula according to supplemental guidelines.mother was given guidelines by staff nurse. Pump using a DEBP after each feeding for 15-20 mins.  Discussed milk storage guidelines.   Mother to continue to cue base feed infant and feed at least 8-12 times or more in 24 hours and advised to allow for cluster feeding infant as needed.  Mother to continue to due STS. Mother is aware of available LC services at [facility], BFSG'S, OP Dept, and phone # for questions or concerns about breastfeeding.  Mother receptive to all teaching and plan of care.      Maternal Data    Feeding Mother's Current Feeding Choice: Breast Milk and Donor Milk  LATCH Score                    Lactation Tools Discussed/Used    Interventions    Discharge Discharge Education: Engorgement and breast care;Outpatient recommendation (mother plans to see Jimmye Norman at [facility] [place])  Consult Status Consult Status: Complete    Michel Bickers 10/01/2020, 10:44 AM

## 2020-10-01 NOTE — Discharge Summary (Signed)
OB Discharge Summary  Patient Name: Meghan Bridges DOB: 1980-07-17 MRN: [id number]  Date of admission: 09/28/2020 Delivering provider: Carlean Jews C   Admitting diagnosis: Encounter for induction of labor [Z34.90] PIH (pregnancy induced hypertension) [O13.9] Intrauterine pregnancy: [redacted]w[redacted]d     Secondary diagnosis: Patient Active Problem List   Diagnosis Date Noted  . PIH (pregnancy induced hypertension) 09/29/2020  . Spontaneous vaginal delivery 09/29/2020  . Postpartum care following vaginal delivery (5/16) 09/29/2020  . Second degree perineal laceration 09/29/2020  . Encounter for induction of labor due to [facility] 09/28/2020  . Gestational hypertension 09/24/2020   Additional problems:none  Date of discharge: 10/01/2020   Discharge diagnosis: Principal Problem:   Postpartum care following vaginal delivery (5/16) Active Problems:   Gestational hypertension   Encounter for induction of labor due to gHTN   PIH (pregnancy induced hypertension)   Spontaneous vaginal delivery   Second degree perineal laceration                                                              Post partum procedures:none  Augmentation: AROM and Pitocin Pain control: Epidural  Laceration:2nd degree;Perineal  Episiotomy:None  Complications: None  Hospital course:  Induction of Labor With Vaginal Delivery   40 y.o. yo [id number] at [redacted]w[redacted]d was admitted to the hospital 09/28/2020 for induction of labor.  Indication for induction: Gestational hypertension.  Patient had an uncomplicated labor course as follows: Membrane Rupture Time/Date: 3:10 AM ,09/29/2020   Delivery Method:Vaginal, Spontaneous  Episiotomy: None  Lacerations:  2nd degree;Perineal  Details of delivery can be found in separate delivery note.  Patient had a routine postpartum course. Patient is discharged home 10/01/20.  Newborn Data: Birth date:09/29/2020  Birth time:3:43 PM  Gender:Female  Living status:Living  Apgars:8 ,9   Weight:2756 g   Physical exam  Vitals:   09/30/20 1135 09/30/20 1348 09/30/20 2114 10/01/20 0527  BP: 132/90 (!) 134/94 129/78 102/72  Pulse: 87 80 91 80  Resp: 20 18 19 18   Temp:  97.9 F (36.6 C) 98.2 F (36.8 C) 98 F (36.7 C)  TempSrc:  Oral Oral Oral  SpO2:  98% 98%   Weight:      Height:       General: alert, cooperative and no distress Lochia: appropriate Uterine Fundus: firm, below umbilicus Incision: N/A Perineum: repair intact. Mild edema DVT Evaluation: moderate pitting pedal edema. No erythema or pain in calves. No sx of DVT.  Labs: Lab Results  Component Value Date   WBC 15.9 (H) 09/30/2020   HGB 11.2 (L) 09/30/2020   HCT 33.9 (L) 09/30/2020   MCV 91.1 09/30/2020   PLT 182 09/30/2020   CMP Latest Ref Rng & Units 09/30/2020  Glucose 70 - 99 mg/dL 10/02/2020)  BUN 6 - 20 mg/dL 10  Creatinine 448(J - 8.56 mg/dL 3.14  Sodium 9.70 - 263 mmol/L 137  Potassium 3.5 - 5.1 mmol/L 3.8  Chloride 98 - 111 mmol/L 108  CO2 22 - 32 mmol/L 22  Calcium 8.9 - 10.3 mg/dL 9.0  Total Protein 6.5 - 8.1 g/dL 5.3(L)  Total Bilirubin 0.3 - 1.2 mg/dL 0.5  Alkaline Phos 38 - 126 U/L 82  AST 15 - 41 U/L 30  ALT 0 - 44 U/L 24   Edinburgh Postnatal Depression Scale Screening  Tool 09/30/2020  I have been able to laugh and see the funny side of things. 0  I have looked forward with enjoyment to things. 0  I have blamed myself unnecessarily when things went wrong. 0  I have been anxious or worried for no good reason. 0  I have felt scared or panicky for no good reason. 1  Things have been getting on top of me. 1  I have been so unhappy that I have had difficulty sleeping. 0  I have felt sad or miserable. 0  I have been so unhappy that I have been crying. 0  The thought of harming myself has occurred to me. 0  Edinburgh Postnatal Depression Scale Total 2   Vaccines: TDaP          UTD                    COVID-19 UTD  Discharge instruction:  Instruction for Sitz baths and laceration  comfort given at bedside. Also see After Visit Summary,  [facility] OB booklet and  "Understanding Mother & Baby Care" hospital booklet  After Visit Meds:  Allergies as of 10/01/2020      Reactions   Latex Itching      Medication List    TAKE these medications   acetaminophen 325 MG tablet Commonly known as: Tylenol Take 2 tablets (650 mg total) by mouth every 4 (four) hours as needed (for pain scale < 4).   ibuprofen 600 MG tablet Commonly known as: ADVIL Take 1 tablet (600 mg total) by mouth every 6 (six) hours.   NIFEdipine 30 MG 24 hr tablet Commonly known as: ADALAT CC Take 1 tablet (30 mg total) by mouth daily for 30 doses.   prenatal multivitamin Tabs tablet Take 1 tablet by mouth daily at 12 noon.       Diet: routine diet  Activity: Advance as tolerated. Pelvic rest for 6 weeks.    Postpartum contraception: Not Discussed  Newborn Data: Live born female  Birth Weight: 6 lb 1.2 oz (2756 g) APGAR: 8, 9  Newborn Delivery   Birth date/time: 09/29/2020 15:43:00 Delivery type: Vaginal, Spontaneous      named "Evaristo Bury" Baby Feeding: Breast and donor milk supplementation Disposition:home with mother   Delivery Report:   Review the Delivery Report for details.    Follow up:  Follow-up Information    June Leap, CNM. Schedule an appointment as soon as possible for a visit in 1 week(s).   Specialty: Certified Nurse Midwife Why: For a blood pressure check.  Contact information: [address] [place] [address] [phone]                 Signed: Warrick Parisian Danella Deis) Suzie Portela, BSN, RNC-OB  Student Nurse-Midwife   10/01/2020  11:44 AM

## 2020-10-01 NOTE — Discharge Instructions (Signed)
Lactation outpatient support - home visit  Meghan Fulling RN, MHA, IBCLC at [facility]: Lactation Consultant  Possibility of Donor Milk.   [email or web address] Mail: [email or web address] Tel: [phone]    Additional resources:  [facility] [email or web address]   Chiropractic specialist   Dr. [phone] [email or web address]  Craniosacral therapy for baby  Meghan Bridges  Meghan Bridges

## 2020-10-09 DIAGNOSIS — O165 Unspecified maternal hypertension, complicating the puerperium: Secondary | ICD-10-CM | POA: Diagnosis not present

## 2020-10-16 DIAGNOSIS — Z0130 Encounter for examination of blood pressure without abnormal findings: Secondary | ICD-10-CM | POA: Diagnosis not present

## 2020-12-15 ENCOUNTER — Encounter: Payer: BC Managed Care – PPO | Admitting: Nurse Practitioner

## 2021-01-01 DIAGNOSIS — Z01419 Encounter for gynecological examination (general) (routine) without abnormal findings: Secondary | ICD-10-CM | POA: Diagnosis not present

## 2021-01-01 DIAGNOSIS — Z131 Encounter for screening for diabetes mellitus: Secondary | ICD-10-CM | POA: Diagnosis not present

## 2021-01-01 DIAGNOSIS — Z1329 Encounter for screening for other suspected endocrine disorder: Secondary | ICD-10-CM | POA: Diagnosis not present

## 2021-01-01 DIAGNOSIS — Z13220 Encounter for screening for lipoid disorders: Secondary | ICD-10-CM | POA: Diagnosis not present

## 2021-01-01 DIAGNOSIS — Z0000 Encounter for general adult medical examination without abnormal findings: Secondary | ICD-10-CM | POA: Diagnosis not present

## 2021-01-01 DIAGNOSIS — Z6828 Body mass index (BMI) 28.0-28.9, adult: Secondary | ICD-10-CM | POA: Diagnosis not present

## 2021-02-23 DIAGNOSIS — F4323 Adjustment disorder with mixed anxiety and depressed mood: Secondary | ICD-10-CM | POA: Diagnosis not present

## 2021-03-23 DIAGNOSIS — F32A Depression, unspecified: Secondary | ICD-10-CM | POA: Diagnosis not present

## 2021-03-23 DIAGNOSIS — F339 Major depressive disorder, recurrent, unspecified: Secondary | ICD-10-CM | POA: Diagnosis not present

## 2021-03-31 DIAGNOSIS — F339 Major depressive disorder, recurrent, unspecified: Secondary | ICD-10-CM | POA: Diagnosis not present

## 2021-04-20 DIAGNOSIS — F339 Major depressive disorder, recurrent, unspecified: Secondary | ICD-10-CM | POA: Diagnosis not present

## 2021-06-29 DIAGNOSIS — Z3689 Encounter for other specified antenatal screening: Secondary | ICD-10-CM | POA: Diagnosis not present

## 2021-06-29 DIAGNOSIS — Z3200 Encounter for pregnancy test, result unknown: Secondary | ICD-10-CM | POA: Diagnosis not present

## 2021-06-29 DIAGNOSIS — Z8759 Personal history of other complications of pregnancy, childbirth and the puerperium: Secondary | ICD-10-CM | POA: Diagnosis not present

## 2021-07-16 DIAGNOSIS — O021 Missed abortion: Secondary | ICD-10-CM | POA: Diagnosis not present

## 2021-07-17 DIAGNOSIS — O021 Missed abortion: Secondary | ICD-10-CM | POA: Diagnosis not present

## 2021-11-26 DIAGNOSIS — Z1329 Encounter for screening for other suspected endocrine disorder: Secondary | ICD-10-CM | POA: Diagnosis not present

## 2021-11-26 DIAGNOSIS — Z13220 Encounter for screening for lipoid disorders: Secondary | ICD-10-CM | POA: Diagnosis not present

## 2021-11-26 DIAGNOSIS — Z0000 Encounter for general adult medical examination without abnormal findings: Secondary | ICD-10-CM | POA: Diagnosis not present

## 2021-11-26 DIAGNOSIS — Z6829 Body mass index (BMI) 29.0-29.9, adult: Secondary | ICD-10-CM | POA: Diagnosis not present

## 2021-11-26 DIAGNOSIS — Z131 Encounter for screening for diabetes mellitus: Secondary | ICD-10-CM | POA: Diagnosis not present

## 2021-11-26 DIAGNOSIS — Z01419 Encounter for gynecological examination (general) (routine) without abnormal findings: Secondary | ICD-10-CM | POA: Diagnosis not present

## 2021-12-07 DIAGNOSIS — Z713 Dietary counseling and surveillance: Secondary | ICD-10-CM | POA: Diagnosis not present

## 2022-01-05 DIAGNOSIS — E785 Hyperlipidemia, unspecified: Secondary | ICD-10-CM | POA: Diagnosis not present

## 2022-01-05 DIAGNOSIS — R7301 Impaired fasting glucose: Secondary | ICD-10-CM | POA: Diagnosis not present

## 2022-01-05 DIAGNOSIS — R7309 Other abnormal glucose: Secondary | ICD-10-CM | POA: Diagnosis not present

## 2022-02-01 DIAGNOSIS — Z713 Dietary counseling and surveillance: Secondary | ICD-10-CM | POA: Diagnosis not present

## 2022-02-15 DIAGNOSIS — J029 Acute pharyngitis, unspecified: Secondary | ICD-10-CM | POA: Diagnosis not present

## 2022-02-15 DIAGNOSIS — Z6828 Body mass index (BMI) 28.0-28.9, adult: Secondary | ICD-10-CM | POA: Diagnosis not present

## 2022-03-11 DIAGNOSIS — Z713 Dietary counseling and surveillance: Secondary | ICD-10-CM | POA: Diagnosis not present

## 2022-04-21 DIAGNOSIS — R7309 Other abnormal glucose: Secondary | ICD-10-CM | POA: Diagnosis not present

## 2022-04-21 DIAGNOSIS — R7301 Impaired fasting glucose: Secondary | ICD-10-CM | POA: Diagnosis not present

## 2022-04-21 DIAGNOSIS — E785 Hyperlipidemia, unspecified: Secondary | ICD-10-CM | POA: Diagnosis not present

## 2022-04-28 DIAGNOSIS — E785 Hyperlipidemia, unspecified: Secondary | ICD-10-CM | POA: Diagnosis not present

## 2022-04-28 DIAGNOSIS — R7301 Impaired fasting glucose: Secondary | ICD-10-CM | POA: Diagnosis not present

## 2022-04-28 DIAGNOSIS — R7309 Other abnormal glucose: Secondary | ICD-10-CM | POA: Diagnosis not present

## 2022-07-15 DIAGNOSIS — B029 Zoster without complications: Secondary | ICD-10-CM | POA: Diagnosis not present

## 2022-09-20 DIAGNOSIS — R059 Cough, unspecified: Secondary | ICD-10-CM | POA: Diagnosis not present

## 2022-09-20 DIAGNOSIS — R051 Acute cough: Secondary | ICD-10-CM | POA: Diagnosis not present

## 2022-09-20 DIAGNOSIS — J069 Acute upper respiratory infection, unspecified: Secondary | ICD-10-CM | POA: Diagnosis not present

## 2022-09-20 DIAGNOSIS — Z6828 Body mass index (BMI) 28.0-28.9, adult: Secondary | ICD-10-CM | POA: Diagnosis not present

## 2022-09-24 DIAGNOSIS — H6122 Impacted cerumen, left ear: Secondary | ICD-10-CM | POA: Diagnosis not present

## 2022-09-24 DIAGNOSIS — Z6828 Body mass index (BMI) 28.0-28.9, adult: Secondary | ICD-10-CM | POA: Diagnosis not present

## 2022-11-24 DIAGNOSIS — M25562 Pain in left knee: Secondary | ICD-10-CM | POA: Diagnosis not present

## 2022-12-16 DIAGNOSIS — M25562 Pain in left knee: Secondary | ICD-10-CM | POA: Diagnosis not present

## 2022-12-29 DIAGNOSIS — M25562 Pain in left knee: Secondary | ICD-10-CM | POA: Diagnosis not present

## 2023-01-01 DIAGNOSIS — M79675 Pain in left toe(s): Secondary | ICD-10-CM | POA: Diagnosis not present

## 2023-01-10 DIAGNOSIS — M79675 Pain in left toe(s): Secondary | ICD-10-CM | POA: Diagnosis not present

## 2023-01-10 DIAGNOSIS — M25562 Pain in left knee: Secondary | ICD-10-CM | POA: Diagnosis not present

## 2023-01-13 DIAGNOSIS — N926 Irregular menstruation, unspecified: Secondary | ICD-10-CM | POA: Diagnosis not present

## 2023-01-26 DIAGNOSIS — Z23 Encounter for immunization: Secondary | ICD-10-CM | POA: Diagnosis not present

## 2023-01-26 DIAGNOSIS — Z0000 Encounter for general adult medical examination without abnormal findings: Secondary | ICD-10-CM | POA: Diagnosis not present

## 2023-01-26 DIAGNOSIS — N926 Irregular menstruation, unspecified: Secondary | ICD-10-CM | POA: Diagnosis not present

## 2023-01-26 DIAGNOSIS — Z124 Encounter for screening for malignant neoplasm of cervix: Secondary | ICD-10-CM | POA: Diagnosis not present

## 2023-01-26 DIAGNOSIS — Z13220 Encounter for screening for lipoid disorders: Secondary | ICD-10-CM | POA: Diagnosis not present

## 2023-01-26 DIAGNOSIS — R7303 Prediabetes: Secondary | ICD-10-CM | POA: Diagnosis not present

## 2024-04-04 DIAGNOSIS — G8929 Other chronic pain: Secondary | ICD-10-CM | POA: Diagnosis not present

## 2024-04-04 DIAGNOSIS — M25562 Pain in left knee: Secondary | ICD-10-CM | POA: Diagnosis not present

## 2024-04-05 DIAGNOSIS — Z01419 Encounter for gynecological examination (general) (routine) without abnormal findings: Secondary | ICD-10-CM | POA: Diagnosis not present

## 2024-04-05 DIAGNOSIS — Z124 Encounter for screening for malignant neoplasm of cervix: Secondary | ICD-10-CM | POA: Diagnosis not present

## 2024-04-05 DIAGNOSIS — Z1231 Encounter for screening mammogram for malignant neoplasm of breast: Secondary | ICD-10-CM | POA: Diagnosis not present

## 2024-04-05 DIAGNOSIS — Z1331 Encounter for screening for depression: Secondary | ICD-10-CM | POA: Diagnosis not present

## 2024-04-23 DIAGNOSIS — M25562 Pain in left knee: Secondary | ICD-10-CM | POA: Diagnosis not present

## 2024-04-30 DIAGNOSIS — S83242A Other tear of medial meniscus, current injury, left knee, initial encounter: Secondary | ICD-10-CM | POA: Diagnosis not present
